# Patient Record
Sex: Female | Born: 1975 | Race: White | Hispanic: No | Marital: Married | Smoking: Never smoker
Health system: Southern US, Community
[De-identification: ages and names within clinical notes are randomized; demographics above are authoritative.]

## PROBLEM LIST (undated history)

## (undated) DIAGNOSIS — E039 Hypothyroidism, unspecified: Secondary | ICD-10-CM

## (undated) DIAGNOSIS — R51 Headache: Secondary | ICD-10-CM

## (undated) DIAGNOSIS — R112 Nausea with vomiting, unspecified: Secondary | ICD-10-CM

## (undated) DIAGNOSIS — R519 Headache, unspecified: Secondary | ICD-10-CM

## (undated) DIAGNOSIS — Z9889 Other specified postprocedural states: Secondary | ICD-10-CM

## (undated) DIAGNOSIS — K219 Gastro-esophageal reflux disease without esophagitis: Secondary | ICD-10-CM

## (undated) DIAGNOSIS — Z8489 Family history of other specified conditions: Secondary | ICD-10-CM

## (undated) DIAGNOSIS — E785 Hyperlipidemia, unspecified: Secondary | ICD-10-CM

## (undated) DIAGNOSIS — T7840XA Allergy, unspecified, initial encounter: Secondary | ICD-10-CM

## (undated) DIAGNOSIS — N809 Endometriosis, unspecified: Secondary | ICD-10-CM

## (undated) HISTORY — PX: OVARIAN CYST REMOVAL: SHX89

## (undated) HISTORY — PX: OTHER SURGICAL HISTORY: SHX169

## (undated) HISTORY — DX: Headache, unspecified: R51.9

## (undated) HISTORY — DX: Endometriosis, unspecified: N80.9

## (undated) HISTORY — DX: Gastro-esophageal reflux disease without esophagitis: K21.9

## (undated) HISTORY — DX: Headache: R51

## (undated) HISTORY — DX: Allergy, unspecified, initial encounter: T78.40XA

## (undated) HISTORY — DX: Hyperlipidemia, unspecified: E78.5

## (undated) HISTORY — PX: TONSILLECTOMY: SUR1361

## (undated) HISTORY — PX: APPENDECTOMY: SHX54

## (undated) HISTORY — PX: SHOULDER SURGERY: SHX246

---

## 2005-03-15 ENCOUNTER — Ambulatory Visit: Payer: Self-pay | Admitting: Obstetrics & Gynecology

## 2005-07-12 ENCOUNTER — Ambulatory Visit: Payer: Self-pay | Admitting: Family Medicine

## 2005-12-31 ENCOUNTER — Emergency Department: Payer: Self-pay | Admitting: Internal Medicine

## 2006-02-05 ENCOUNTER — Ambulatory Visit: Payer: Self-pay

## 2006-03-09 ENCOUNTER — Ambulatory Visit: Payer: Self-pay | Admitting: Orthopaedic Surgery

## 2006-03-20 ENCOUNTER — Encounter: Payer: Self-pay | Admitting: Orthopaedic Surgery

## 2006-03-29 ENCOUNTER — Encounter: Payer: Self-pay | Admitting: Orthopaedic Surgery

## 2006-04-28 ENCOUNTER — Encounter: Payer: Self-pay | Admitting: Orthopaedic Surgery

## 2006-07-12 ENCOUNTER — Emergency Department: Payer: Self-pay

## 2007-04-11 ENCOUNTER — Ambulatory Visit: Payer: Self-pay | Admitting: Orthopaedic Surgery

## 2007-04-17 ENCOUNTER — Ambulatory Visit: Payer: Self-pay | Admitting: Radiology

## 2007-04-19 ENCOUNTER — Ambulatory Visit: Payer: Self-pay | Admitting: Family Medicine

## 2007-05-08 ENCOUNTER — Ambulatory Visit: Payer: Self-pay | Admitting: Orthopaedic Surgery

## 2007-05-10 ENCOUNTER — Ambulatory Visit: Payer: Self-pay | Admitting: Orthopaedic Surgery

## 2008-09-01 DIAGNOSIS — Z8249 Family history of ischemic heart disease and other diseases of the circulatory system: Secondary | ICD-10-CM | POA: Insufficient documentation

## 2008-09-05 DIAGNOSIS — J301 Allergic rhinitis due to pollen: Secondary | ICD-10-CM

## 2008-09-05 HISTORY — DX: Allergic rhinitis due to pollen: J30.1

## 2009-03-04 ENCOUNTER — Other Ambulatory Visit: Payer: Self-pay

## 2009-03-04 DIAGNOSIS — R110 Nausea: Secondary | ICD-10-CM | POA: Insufficient documentation

## 2009-03-04 HISTORY — DX: Nausea: R11.0

## 2009-03-08 ENCOUNTER — Ambulatory Visit: Payer: Self-pay

## 2009-04-02 DIAGNOSIS — K2100 Gastro-esophageal reflux disease with esophagitis, without bleeding: Secondary | ICD-10-CM | POA: Insufficient documentation

## 2009-08-20 ENCOUNTER — Ambulatory Visit: Payer: Self-pay | Admitting: Obstetrics & Gynecology

## 2010-08-31 ENCOUNTER — Ambulatory Visit: Payer: Self-pay | Admitting: Family Medicine

## 2011-03-14 ENCOUNTER — Ambulatory Visit: Payer: Self-pay | Admitting: Family Medicine

## 2011-03-17 ENCOUNTER — Ambulatory Visit: Payer: Self-pay | Admitting: Family Medicine

## 2011-03-21 ENCOUNTER — Ambulatory Visit: Payer: Self-pay | Admitting: Family Medicine

## 2011-09-22 ENCOUNTER — Other Ambulatory Visit: Payer: Self-pay | Admitting: Physician Assistant

## 2011-09-22 LAB — CBC WITH DIFFERENTIAL/PLATELET
Basophil %: 1.6 %
Eosinophil #: 0.1 10*3/uL (ref 0.0–0.7)
HCT: 40.1 % (ref 35.0–47.0)
HGB: 13.6 g/dL (ref 12.0–16.0)
MCH: 28.5 pg (ref 26.0–34.0)
MCHC: 33.9 g/dL (ref 32.0–36.0)
MCV: 84 fL (ref 80–100)
Monocyte #: 0.8 x10 3/mm (ref 0.2–0.9)
Monocyte %: 10.4 %
Neutrophil #: 4.8 10*3/uL (ref 1.4–6.5)
Neutrophil %: 59.9 %
WBC: 8 10*3/uL (ref 3.6–11.0)

## 2011-09-22 LAB — MONONUCLEOSIS SCREEN: Mono Test: NEGATIVE

## 2011-09-22 LAB — TSH: Thyroid Stimulating Horm: 0.88 u[IU]/mL

## 2011-09-25 ENCOUNTER — Emergency Department: Payer: Self-pay | Admitting: Emergency Medicine

## 2011-09-25 LAB — PREGNANCY, URINE: Pregnancy Test, Urine: NEGATIVE m[IU]/mL

## 2011-09-28 ENCOUNTER — Other Ambulatory Visit: Payer: Self-pay | Admitting: Family Medicine

## 2011-09-28 LAB — COMPREHENSIVE METABOLIC PANEL
Albumin: 4.4 g/dL (ref 3.4–5.0)
Alkaline Phosphatase: 70 U/L (ref 50–136)
Calcium, Total: 9.5 mg/dL (ref 8.5–10.1)
Chloride: 106 mmol/L (ref 98–107)
Co2: 28 mmol/L (ref 21–32)
Creatinine: 0.77 mg/dL (ref 0.60–1.30)
Glucose: 80 mg/dL (ref 65–99)
Osmolality: 278 (ref 275–301)
Potassium: 4.7 mmol/L (ref 3.5–5.1)
SGOT(AST): 20 U/L (ref 15–37)
SGPT (ALT): 18 U/L
Total Protein: 7.7 g/dL (ref 6.4–8.2)

## 2011-09-28 LAB — MAGNESIUM: Magnesium: 2 mg/dL

## 2011-10-06 ENCOUNTER — Other Ambulatory Visit: Payer: Self-pay

## 2011-10-06 LAB — TSH: Thyroid Stimulating Horm: 1.28 u[IU]/mL

## 2013-05-11 IMAGING — US US THYROID
1 series · 18 of 25 positions shown · non-contrast
Comparison: none

REASON FOR EXAM: neck swelling and tenderness
COMMENTS:

PROCEDURE:     US  - US THYROID  - September 25, 2011  [DATE]
RESULT:     Standard thyroid ultrasound obtained. Size, shape, and
echotexture the thyroid normal. No evidence of mass lesion. Normal blood
flow.

[Series 1: us thyroid · 18 of 30 slices shown]
[im 1/30]
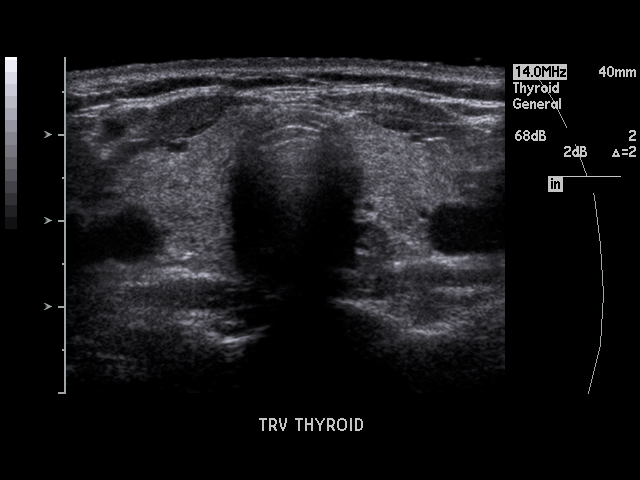
[im 3/30]
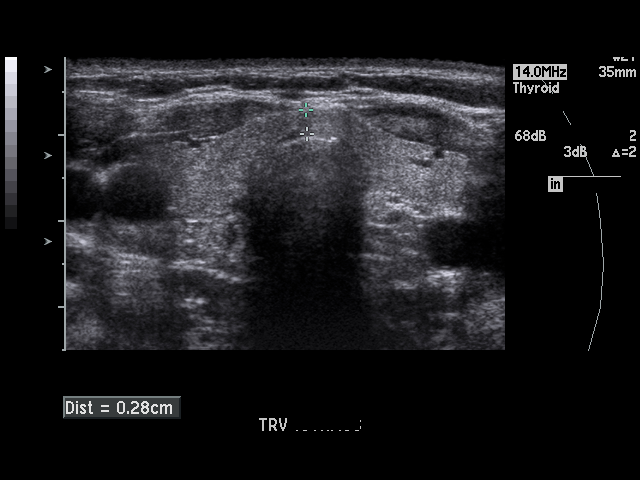
[im 4/30]
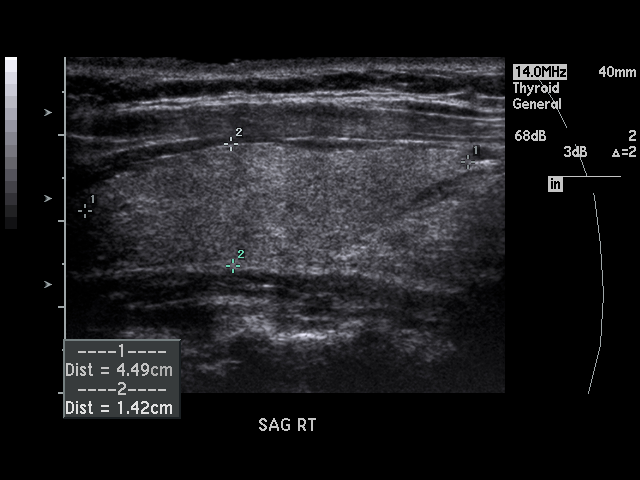
[im 5/30]
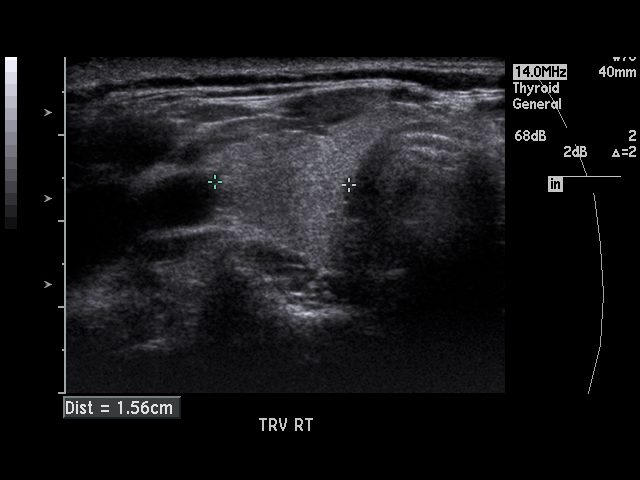
[im 8/30]
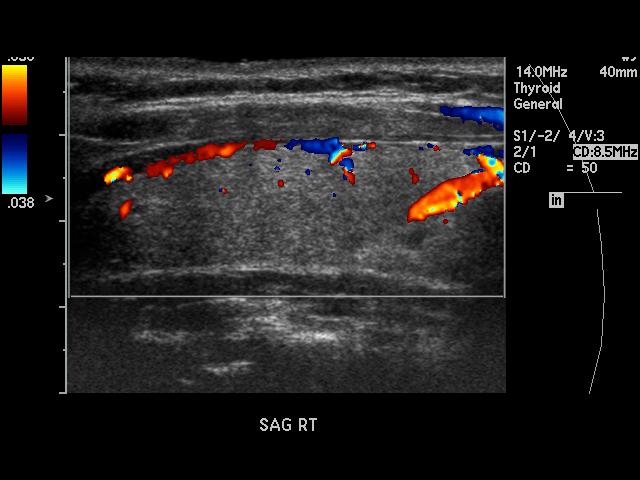
[im 9/30]
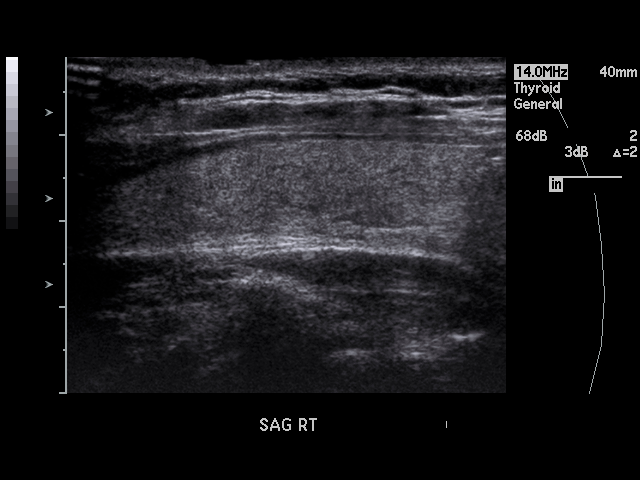
[im 11/30]
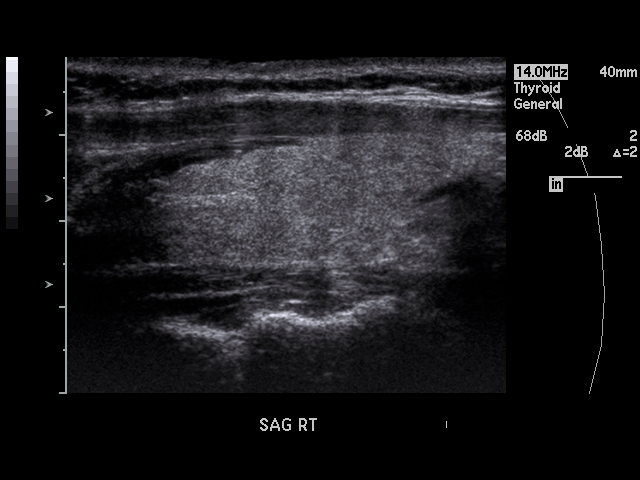
[im 13/30]
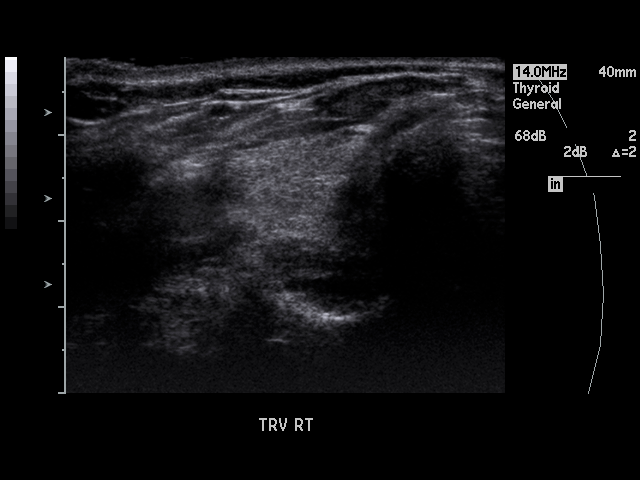
[im 14/30]
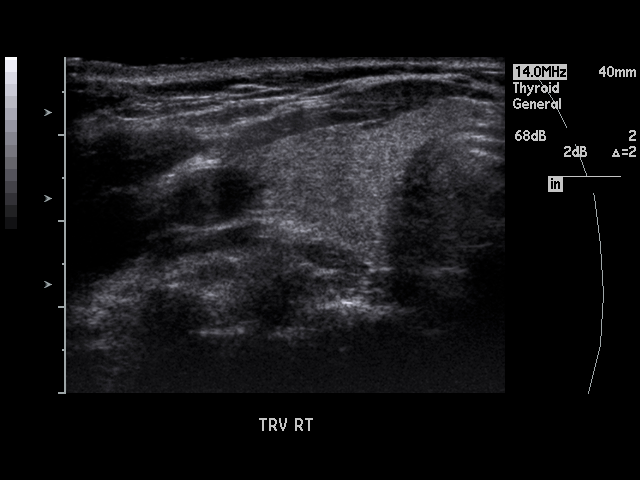
[im 16/30]
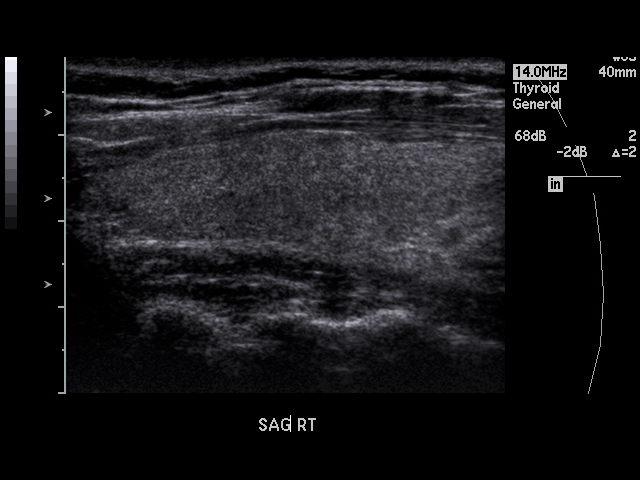
[im 17/30]
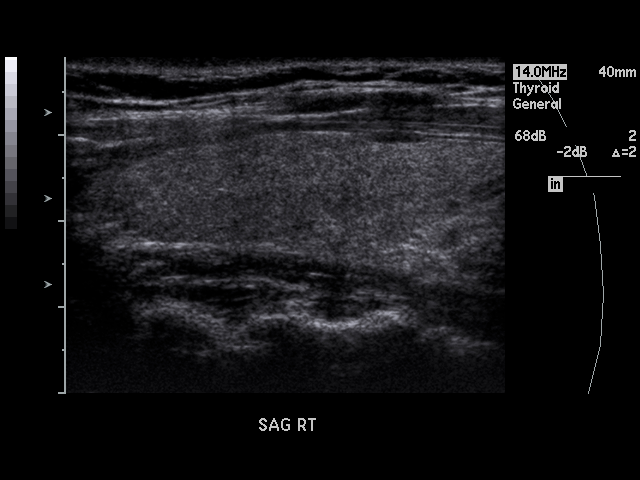
[im 19/30]
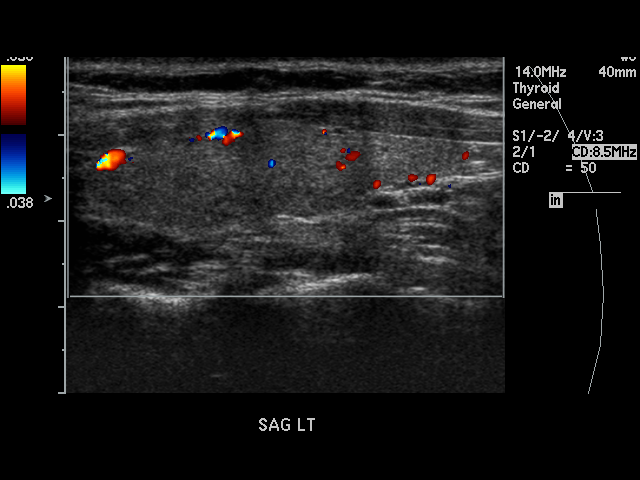
[im 21/30]
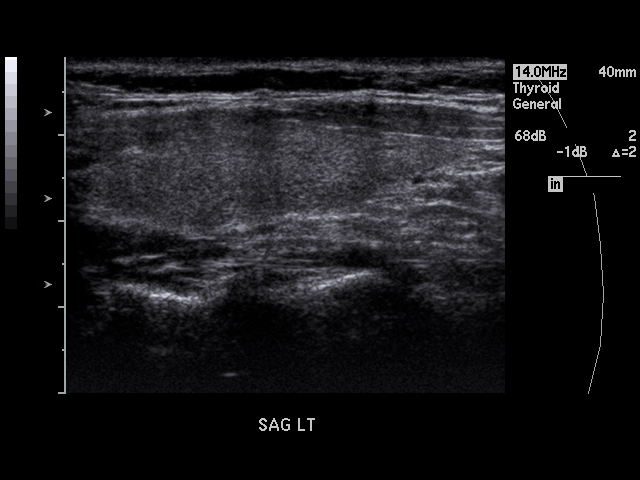
[im 22/30]
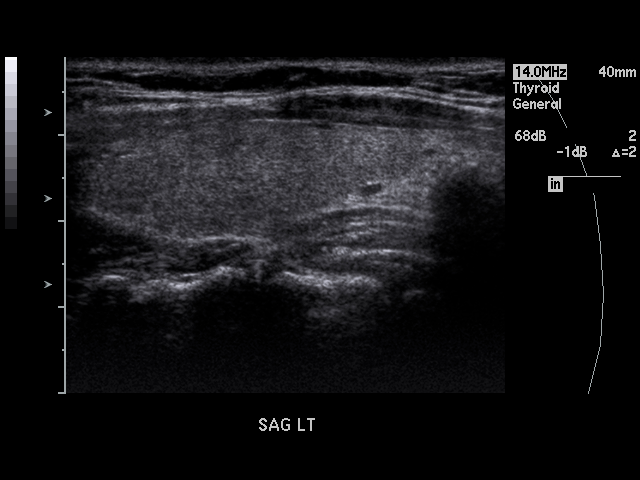
[im 25/30]
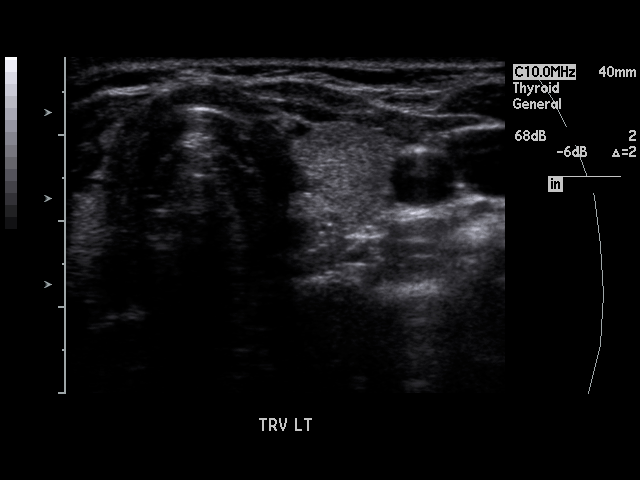
[im 26/30]
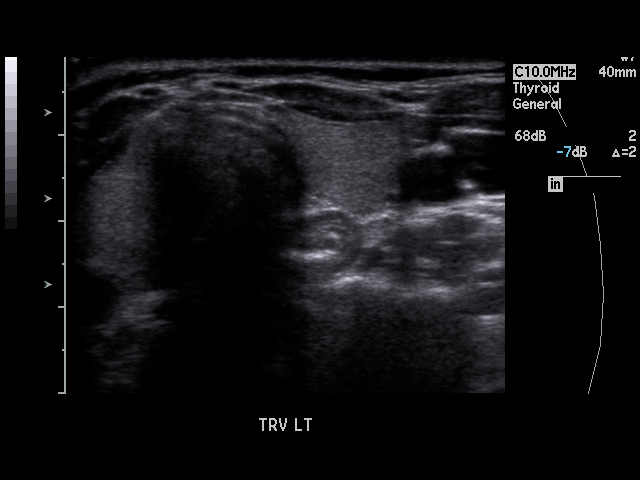
[im 27/30]
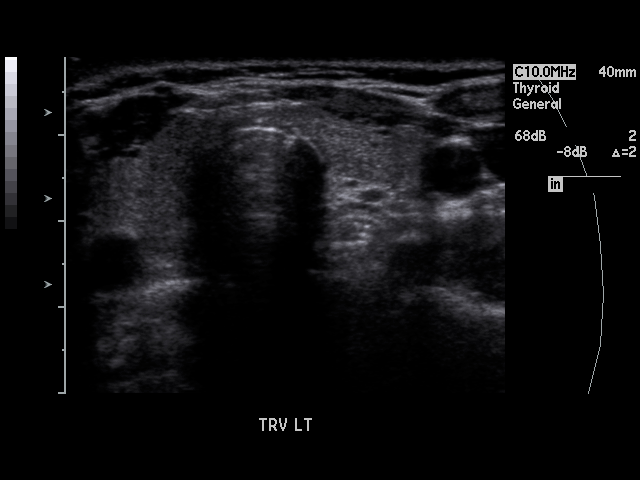
[im 30/30]
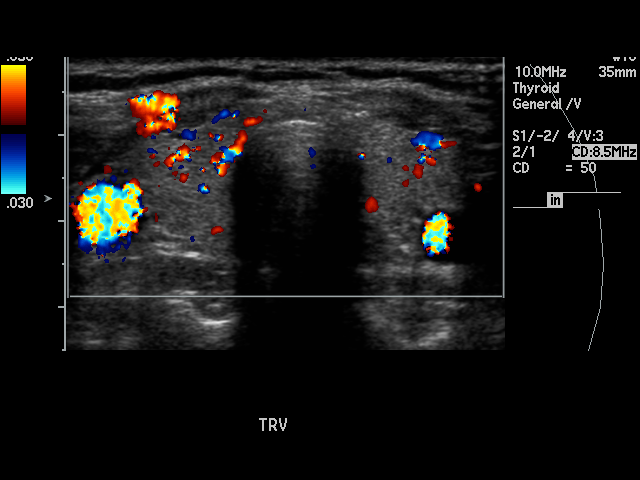

[18 of 25 positions shown; findings below may reference images not displayed]

IMPRESSION: Normal exam.

## 2014-01-27 ENCOUNTER — Ambulatory Visit: Payer: Self-pay | Admitting: Family Medicine

## 2014-01-27 LAB — HEPATIC FUNCTION PANEL A (ARMC)
ALT: 22 U/L
AST: 27 U/L (ref 15–37)
Albumin: 4.2 g/dL (ref 3.4–5.0)
Alkaline Phosphatase: 70 U/L
BILIRUBIN TOTAL: 0.6 mg/dL (ref 0.2–1.0)
Bilirubin, Direct: 0.1 mg/dL (ref 0.00–0.20)
Total Protein: 7.6 g/dL (ref 6.4–8.2)

## 2014-01-27 LAB — CBC WITH DIFFERENTIAL/PLATELET
BASOS PCT: 1.2 %
Basophil #: 0.1 10*3/uL (ref 0.0–0.1)
EOS ABS: 0.1 10*3/uL (ref 0.0–0.7)
EOS PCT: 1.4 %
HCT: 40.5 % (ref 35.0–47.0)
HGB: 13.1 g/dL (ref 12.0–16.0)
LYMPHS ABS: 1.9 10*3/uL (ref 1.0–3.6)
Lymphocyte %: 25.3 %
MCH: 27.8 pg (ref 26.0–34.0)
MCHC: 32.4 g/dL (ref 32.0–36.0)
MCV: 86 fL (ref 80–100)
MONO ABS: 0.7 x10 3/mm (ref 0.2–0.9)
Monocyte %: 8.6 %
Neutrophil #: 4.9 10*3/uL (ref 1.4–6.5)
Neutrophil %: 63.5 %
Platelet: 210 10*3/uL (ref 150–440)
RBC: 4.72 10*6/uL (ref 3.80–5.20)
RDW: 13.3 % (ref 11.5–14.5)
WBC: 7.7 10*3/uL (ref 3.6–11.0)

## 2014-01-27 LAB — SEDIMENTATION RATE: Erythrocyte Sed Rate: 3 mm/hr (ref 0–20)

## 2015-02-04 ENCOUNTER — Other Ambulatory Visit: Payer: Self-pay | Admitting: Physician Assistant

## 2015-02-04 ENCOUNTER — Ambulatory Visit
Admission: RE | Admit: 2015-02-04 | Discharge: 2015-02-04 | Disposition: A | Discharge status: Home / Self Care | Payer: 59 | Source: Ambulatory Visit | Attending: Physician Assistant | Admitting: Physician Assistant

## 2015-02-04 DIAGNOSIS — R05 Cough: Secondary | ICD-10-CM

## 2015-02-04 DIAGNOSIS — R059 Cough, unspecified: Secondary | ICD-10-CM

## 2015-06-09 DIAGNOSIS — M24811 Other specific joint derangements of right shoulder, not elsewhere classified: Secondary | ICD-10-CM | POA: Diagnosis not present

## 2015-06-09 DIAGNOSIS — M67911 Unspecified disorder of synovium and tendon, right shoulder: Secondary | ICD-10-CM | POA: Diagnosis not present

## 2016-01-17 ENCOUNTER — Ambulatory Visit: Payer: Self-pay | Admitting: Physician Assistant

## 2016-01-17 ENCOUNTER — Encounter: Payer: Self-pay | Admitting: Physician Assistant

## 2016-01-17 VITALS — BP 120/90 | HR 80 | Temp 98.3°F

## 2016-01-17 DIAGNOSIS — L259 Unspecified contact dermatitis, unspecified cause: Secondary | ICD-10-CM

## 2016-01-17 MED ORDER — DEXAMETHASONE SODIUM PHOSPHATE 10 MG/ML IJ SOLN
10.0000 mg | Freq: Once | INTRAMUSCULAR | Status: AC
  Administered 2016-01-17: 10 mg via INTRAMUSCULAR

## 2016-01-17 NOTE — Progress Notes (Signed)
S: c/o rash on ankles and wrists, not painful, just really itching, no fever/chills, hasn't been in woods and denies tick bite  O: vitals wnl, nad, skin on ankles, lower legs, and wrists/forearms with pale pink raised lesions, no pustules, bruising or redness, ENT wnl, neck supple no lymph, lungs c ta ,cv rrr  A: rash, ?insect bite ' P: decadron 10mg  im, if worsening pt is to call and will call in doxy

## 2016-02-17 DIAGNOSIS — G5601 Carpal tunnel syndrome, right upper limb: Secondary | ICD-10-CM | POA: Diagnosis not present

## 2016-02-17 DIAGNOSIS — G5602 Carpal tunnel syndrome, left upper limb: Secondary | ICD-10-CM | POA: Diagnosis not present

## 2016-03-15 DIAGNOSIS — G5601 Carpal tunnel syndrome, right upper limb: Secondary | ICD-10-CM | POA: Diagnosis not present

## 2016-03-15 DIAGNOSIS — M654 Radial styloid tenosynovitis [de Quervain]: Secondary | ICD-10-CM | POA: Diagnosis not present

## 2016-03-15 DIAGNOSIS — M25732 Osteophyte, left wrist: Secondary | ICD-10-CM | POA: Diagnosis not present

## 2016-03-15 DIAGNOSIS — G5602 Carpal tunnel syndrome, left upper limb: Secondary | ICD-10-CM | POA: Diagnosis not present

## 2016-04-12 DIAGNOSIS — M654 Radial styloid tenosynovitis [de Quervain]: Secondary | ICD-10-CM | POA: Diagnosis not present

## 2016-04-12 DIAGNOSIS — R200 Anesthesia of skin: Secondary | ICD-10-CM | POA: Diagnosis not present

## 2016-04-12 DIAGNOSIS — M778 Other enthesopathies, not elsewhere classified: Secondary | ICD-10-CM | POA: Diagnosis not present

## 2016-05-08 ENCOUNTER — Encounter: Payer: Self-pay | Admitting: Physician Assistant

## 2016-05-08 ENCOUNTER — Ambulatory Visit: Payer: Self-pay | Admitting: Physician Assistant

## 2016-05-08 VITALS — BP 110/86 | HR 80 | Temp 98.2°F

## 2016-05-08 DIAGNOSIS — J0100 Acute maxillary sinusitis, unspecified: Secondary | ICD-10-CM

## 2016-05-08 DIAGNOSIS — R6889 Other general symptoms and signs: Secondary | ICD-10-CM

## 2016-05-08 MED ORDER — TOBRAMYCIN 0.3 % OP SOLN: 2.0000 [drp] | OPHTHALMIC | 0 refills | Status: DC

## 2016-05-08 MED ORDER — PREDNISONE 10 MG PO TABS: 30.0000 mg | ORAL_TABLET | Freq: Every day | ORAL | 0 refills | Status: DC

## 2016-05-08 NOTE — Progress Notes (Signed)
S: C/o sinus pain and congestion for 3 days, no fever, chills, cp/sob, v/d; cough is sporadic, c/o of facial and dental pain.,  Also some pressure at r eye, thought she had something in it and it has been itching, no matting or drainage  Using otc meds:   O: PE: vitals wnl, nad, perrl eomi, conjunctiva wnl, normocephalic, tms dull, nasal mucosa pink and swollen, throat injected, neck supple no lymph, lungs c t a, cv rrr, neuro intact  A:  Acute sinusitis, itchy eyes   P: drink fluids, continue regular meds , use otc meds of choice, return if not improving in 5 days, return earlier if worsening , prednisone 30mg  qd x 3, tobramycin opth solution, if sinus sx worsening will call in an antibiotic, not needed at this time

## 2016-05-10 DIAGNOSIS — G5631 Lesion of radial nerve, right upper limb: Secondary | ICD-10-CM | POA: Diagnosis not present

## 2016-05-17 DIAGNOSIS — G5631 Lesion of radial nerve, right upper limb: Secondary | ICD-10-CM | POA: Diagnosis not present

## 2016-05-18 ENCOUNTER — Other Ambulatory Visit (HOSPITAL_COMMUNITY): Payer: Self-pay | Admitting: Orthopedic Surgery

## 2016-05-18 ENCOUNTER — Other Ambulatory Visit: Payer: Self-pay | Admitting: Orthopedic Surgery

## 2016-05-18 DIAGNOSIS — G5631 Lesion of radial nerve, right upper limb: Secondary | ICD-10-CM

## 2016-05-30 ENCOUNTER — Ambulatory Visit (HOSPITAL_COMMUNITY)
Admission: RE | Admit: 2016-05-30 | Discharge: 2016-05-30 | Disposition: A | Discharge status: Home / Self Care | Payer: 59 | Source: Ambulatory Visit | Attending: Orthopedic Surgery | Admitting: Orthopedic Surgery

## 2016-05-30 DIAGNOSIS — G5631 Lesion of radial nerve, right upper limb: Secondary | ICD-10-CM | POA: Insufficient documentation

## 2016-05-30 DIAGNOSIS — Z0389 Encounter for observation for other suspected diseases and conditions ruled out: Secondary | ICD-10-CM | POA: Diagnosis not present

## 2016-06-01 DIAGNOSIS — G5631 Lesion of radial nerve, right upper limb: Secondary | ICD-10-CM | POA: Diagnosis not present

## 2016-06-05 DIAGNOSIS — G5631 Lesion of radial nerve, right upper limb: Secondary | ICD-10-CM | POA: Diagnosis not present

## 2016-07-03 DIAGNOSIS — R200 Anesthesia of skin: Secondary | ICD-10-CM | POA: Diagnosis not present

## 2016-07-12 ENCOUNTER — Ambulatory Visit (INDEPENDENT_AMBULATORY_CARE_PROVIDER_SITE_OTHER): Payer: 59 | Admitting: Neurology

## 2016-07-12 ENCOUNTER — Encounter: Payer: Self-pay | Admitting: Neurology

## 2016-07-12 VITALS — BP 127/85 | HR 67 | Resp 16 | Ht 62.0 in | Wt 159.0 lb

## 2016-07-12 DIAGNOSIS — M50123 Cervical disc disorder at C6-C7 level with radiculopathy: Secondary | ICD-10-CM

## 2016-07-12 DIAGNOSIS — G5631 Lesion of radial nerve, right upper limb: Secondary | ICD-10-CM | POA: Diagnosis not present

## 2016-07-12 DIAGNOSIS — Z79899 Other long term (current) drug therapy: Secondary | ICD-10-CM | POA: Diagnosis not present

## 2016-07-12 DIAGNOSIS — R29898 Other symptoms and signs involving the musculoskeletal system: Secondary | ICD-10-CM | POA: Diagnosis not present

## 2016-07-12 MED ORDER — PREGABALIN 50 MG PO CAPS: 50.0000 mg | ORAL_CAPSULE | Freq: Two times a day (BID) | ORAL | 6 refills | Status: DC

## 2016-07-12 NOTE — Progress Notes (Signed)
Lyrica rx printed, signed, faxed to pharmacy.

## 2016-07-12 NOTE — Patient Instructions (Signed)
Remember to drink plenty of fluid, eat healthy meals and do not skip any meals. Try to eat protein with a every meal and eat a healthy snack such as fruit or nuts in between meals. Try to keep a regular sleep-wake schedule and try to exercise daily, particularly in the form of walking, 20-30 minutes a day, if you can.   As far as your medications are concerned, I would like to suggest: Lyrica 50mg  twice daily  As far as diagnostic testing: emg/ncs in 8-12 weeks, MRI cervical spine, Physical Therapy, Labs  I would like to see you back for emg/ncs, sooner if we need to. Please call us with any interim questions, concerns, problems, updates or refill requests.   Our phone number is [phone]. We also have an after hours call service for urgent matters and there is a physician on-call for urgent questions. For any emergencies you know to call 911 or go to the nearest emergency room  Pregabalin capsules What is this medicine? PREGABALIN (pre GAB a lin) is used to treat nerve pain from diabetes, shingles, spinal cord injury, and fibromyalgia. It is also used to control seizures in epilepsy. This medicine may be used for other purposes; ask your health care provider or pharmacist if you have questions. COMMON BRAND NAME(S): Lyrica What should I tell my health care provider before I take this medicine? They need to know if you have any of these conditions: -bleeding problems -heart disease, including heart failure -history of alcohol or drug abuse -kidney disease -suicidal thoughts, plans, or attempt; a previous suicide attempt by you or a family member -an unusual or allergic reaction to pregabalin, gabapentin, other medicines, foods, dyes, or preservatives -pregnant or trying to get pregnant or trying to conceive with your partner -breast-feeding How should I use this medicine? Take this medicine by mouth with a glass of water. Follow the directions on the prescription label. You can take this  medicine with or without food. Take your doses at regular intervals. Do not take your medicine more often than directed. Do not stop taking except on your doctor's advice. A special MedGuide will be given to you by the pharmacist with each prescription and refill. Be sure to read this information carefully each time. Talk to your pediatrician regarding the use of this medicine in children. Special care may be needed. Overdosage: If you think you have taken too much of this medicine contact a poison control center or emergency room at once. NOTE: This medicine is only for you. Do not share this medicine with others. What if I miss a dose? If you miss a dose, take it as soon as you can. If it is almost time for your next dose, take only that dose. Do not take double or extra doses. What may interact with this medicine? -alcohol -certain medicines for blood pressure like captopril, enalapril, or lisinopril -certain medicines for diabetes, like pioglitazone or rosiglitazone -certain medicines for anxiety or sleep -narcotic medicines for pain This list may not describe all possible interactions. Give your health care provider a list of all the medicines, herbs, non-prescription drugs, or dietary supplements you use. Also tell them if you smoke, drink alcohol, or use illegal drugs. Some items may interact with your medicine. What should I watch for while using this medicine? Tell your doctor or healthcare professional if your symptoms do not start to get better or if they get worse. Visit your doctor or health care professional for regular checks on your  progress. Do not stop taking except on your doctor's advice. You may develop a severe reaction. Your doctor will tell you how much medicine to take. Wear a medical identification bracelet or chain if you are taking this medicine for seizures, and carry a card that describes your disease and details of your medicine and dosage times. You may get drowsy or  dizzy. Do not drive, use machinery, or do anything that needs mental alertness until you know how this medicine affects you. Do not stand or sit up quickly, especially if you are an older patient. This reduces the risk of dizzy or fainting spells. Alcohol may interfere with the effect of this medicine. Avoid alcoholic drinks. If you have a heart condition, like congestive heart failure, and notice that you are retaining water and have swelling in your hands or feet, contact your health care provider immediately. The use of this medicine may increase the chance of suicidal thoughts or actions. Pay special attention to how you are responding while on this medicine. Any worsening of mood, or thoughts of suicide or dying should be reported to your health care professional right away. This medicine has caused reduced sperm counts in some men. This may interfere with the ability to father a child. You should talk to your doctor or health care professional if you are concerned about your fertility. Women who become pregnant while using this medicine for seizures may enroll in the [place] Pregnancy Registry by calling [phone]. This registry collects information about the safety of antiepileptic drug use during pregnancy. What side effects may I notice from receiving this medicine? Side effects that you should report to your doctor or health care professional as soon as possible: -allergic reactions like skin rash, itching or hives, swelling of the face, lips, or tongue -breathing problems -changes in vision -chest pain -confusion -jerking or unusual movements of any part of your body -loss of memory -muscle pain, tenderness, or weakness -suicidal thoughts or other mood changes -swelling of the ankles, feet, hands -unusual bruising or bleeding Side effects that usually do not require medical attention (report to your doctor or health care professional if they continue or are  bothersome): -dizziness -drowsiness -dry mouth -headache -nausea -tremors -trouble sleeping -weight gain This list may not describe all possible side effects. Call your doctor for medical advice about side effects. You may report side effects to FDA at 1-800-FDA-1088. Where should I keep my medicine? Keep out of the reach of children. This medicine can be abused. Keep your medicine in a safe place to protect it from theft. Do not share this medicine with anyone. Selling or giving away this medicine is dangerous and against the law. This medicine may cause accidental overdose and death if it taken by other adults, children, or pets. Mix any unused medicine with a substance like cat litter or coffee grounds. Then throw the medicine away in a sealed container like a sealed bag or a coffee can with a lid. Do not use the medicine after the expiration date. Store at room temperature between 15 and 30 degrees C (59 and 86 degrees F). NOTE: This sheet is a summary. It may not cover all possible information. If you have questions about this medicine, talk to your doctor, pharmacist, or health care provider.  2017 Elsevier/Gold Standard (2015-06-17 10:26:12)

## 2016-07-12 NOTE — Progress Notes (Signed)
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 female here as a referral from Dr. Caryn Section for right upper extremity pain and numbness. She has severe pain in the dorsal aspect of the arm radiating down to all fingers. She has tingling in digit 4-5 and pain in digits 1-3. Symptoms started over a year ago or longer she denies any inciting events, no trauma, no compression, no crtches, didn;t sleep on it wrong, no inciting events at all. It was slowly progressive. She was also having pain in the left arm but oral prednisone helped. She has "aches and pains all over" but the right is the worst. They tried several injections and an MRI brachial plexus was normal. She has burning in the neck. The MRI examined the right arm as well. There was no compresison in the arm and the brachial plexus was normal. She has weakness in the right hand even trying to write is challenging. She drops things a lot with the right hand. She feels weakness in the biceps and triceps. No pain or weakness in the biceps. She has grip weakness, difficult to start IVs on patients. Mother has Fibromyalgia and has a lot of the same symptoms. Father with RA. Neurontin helped but made her foggy. No other focal neurologic deficits, associated symptoms, inciting events or modifiable factors.   Reviewed notes, labs and imaging from outside physicians, which showed:  Reviewed extensive notes from [place]. The patient initially presented for right upper extremity pain. She underwent selective nerve block of the right radial nerve at the level of the spiral groove on 06/05/2016. She reports that her wrist felt heavy during the lidocaine face. The pain in her arm was not affected during the lidocaine phase nor beyond  then. She has tingling to light touch in the dorsal aspect of the ring and small finger. The numbness and tingling in the dorsum of the hand radially comes and goes. She reports a shooting pain that traveled down her arm. She did undergo MRI scan of her right brachial plexus which failed to reveal any abnormalities of the plexus itself for any extrinsic compression or mass effect. Her symptoms are pain radiating down the arm and forearm on the dorsum. She also occasionally has some tingling in the dorsum of the ring and small fingers particularly of having to rest the medial portion of her elbow on something. She's been seeing [facility] since September 2017 for this issue. They also injected her right dorsal compartment tendon sheath. It appears she also had left-sided symptoms. 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 Pertinent negatives per HPI. All others negative.   Social History   Social History  . Marital status: Married    Spouse name: N/A  . Number of children: 0  . Years of education: Masters    Occupational History  . Not on file.   Social History Main Topics  . Smoking status: Never Smoker  . Smokeless tobacco: Never Used  . Alcohol use Yes  . Drug use: No  . Sexual activity: Not on file   Other Topics Concern  .  Not on file   Social History Narrative   Drinks 1 caffeine drink a day     Family History  Problem Relation Age of Onset  . Fibromyalgia Mother   . Rheum arthritis Father   . Heart attack Father   . Breast cancer Maternal Grandmother   . Colon cancer Maternal Grandmother   . Heart attack Paternal Grandfather   . Esophageal cancer Paternal Grandfather   . Neuropathy Neg Hx     Past Medical History:  Diagnosis Date  . Endometriosis   . Headache      Past Surgical History:  Procedure Laterality Date  . APPENDECTOMY    . labrium repair    . OVARIAN CYST REMOVAL    . SHOULDER SURGERY    . TONSILLECTOMY      Current Outpatient Prescriptions  Medication Sig Dispense Refill  . Cholecalciferol (VITAMIN D) 2000 units CAPS Take by mouth.    . Multiple Vitamin (MULTIVITAMIN) capsule Take 1 capsule by mouth daily.    . pregabalin (LYRICA) 50 MG capsule Take 1 capsule (50 mg total) by mouth 2 (two) times daily. 60 capsule 6   No current facility-administered medications for this visit.     Allergies as of 07/12/2016 - Review Complete 07/12/2016  Allergen Reaction Noted  . Morphine and related  09/01/2008  . Ultram  [tramadol]      Vitals: BP 127/85   Pulse 67   Resp 16   Ht 5\' 2"  (1.575 m)   Wt 159 lb (72.1 kg)   BMI 29.08 kg/m  Last Weight:  Wt Readings from Last 1 Encounters:  07/12/16 159 lb (72.1 kg)   Last Height:   Ht Readings from Last 1 Encounters:  07/12/16 5\' 2"  (1.575 m)    Physical exam: Exam: Gen: NAD, conversant, well nourised, obese, well groomed                     CV: RRR, no MRG. No Carotid Bruits. No peripheral edema, warm, nontender Eyes: Conjunctivae clear without exudates or hemorrhage  Neuro: Detailed Neurologic Exam  Speech:    Speech is normal; fluent and spontaneous with normal comprehension.  Cognition:    The patient is oriented to person, place, and time;     recent and remote memory intact;     language fluent;     normal attention, concentration,     fund of knowledge Cranial Nerves:    The pupils are equal, round, and reactive to light. The fundi are normal and spontaneous venous pulsations are present. Visual fields are full to finger confrontation. Extraocular movements are intact. Trigeminal sensation is intact and the muscles of mastication are normal. The face is symmetric. The palate elevates in the midline. Hearing intact. Voice is normal. Shoulder shrug is normal. The  tongue has normal motion without fasciculations.   Coordination:    Normal finger to nose and heel to shin. Normal rapid alternating movements.   Gait:    Heel-toe and tandem gait are normal.   Motor Observation:    No asymmetry, no atrophy, and no involuntary movements noted. Tone:    Normal muscle tone.    Posture:    Posture is normal. normal erect    Strength: Right delt 4/5 Right Triceps 5-/5 Right Brachioradialis 4+-5-  Otherwise intact     Sensation: dec radial distribution     Reflex Exam:  DTR's:   Reflexes intact including triceps and symmetric.  Deep tendon reflexes in  the upper and lower extremities are normal bilaterally.   Toes:    The toes are downgoing bilaterally.   Clonus:    Clonus is absent.  Assessment/Plan:  41 year old female with right arm weakness. 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    MRI cervical spine: emg/ncs and clinical exam concerning for c6/c7 radiculopathy need evaluation for surgical procedure or ESI Physical therapy right arm Lyrica 50 mg twice a day for pain Labs today Repeat emg/ncs 8 weeks   Sarina Ill, MD  [facility] [address] [place], [place] [address]  Phone [phone] Fax [phone]

## 2016-07-13 ENCOUNTER — Encounter: Payer: Self-pay | Admitting: Neurology

## 2016-07-13 ENCOUNTER — Telehealth: Payer: Self-pay

## 2016-07-13 ENCOUNTER — Telehealth: Payer: Self-pay | Admitting: Neurology

## 2016-07-13 DIAGNOSIS — R29898 Other symptoms and signs involving the musculoskeletal system: Secondary | ICD-10-CM | POA: Insufficient documentation

## 2016-07-13 LAB — CBC
Hematocrit: 39.8 % (ref 34.0–46.6)
Hemoglobin: 13.3 g/dL (ref 11.1–15.9)
MCH: 28.1 pg (ref 26.6–33.0)
MCHC: 33.4 g/dL (ref 31.5–35.7)
MCV: 84 fL (ref 79–97)
PLATELETS: 236 10*3/uL (ref 150–379)
RBC: 4.73 x10E6/uL (ref 3.77–5.28)
RDW: 13.8 % (ref 12.3–15.4)
WBC: 6.7 10*3/uL (ref 3.4–10.8)

## 2016-07-13 LAB — COMPREHENSIVE METABOLIC PANEL
A/G RATIO: 2.1 (ref 1.2–2.2)
ALK PHOS: 59 IU/L (ref 39–117)
ALT: 9 IU/L (ref 0–32)
AST: 13 IU/L (ref 0–40)
Albumin: 4.8 g/dL (ref 3.5–5.5)
BUN/Creatinine Ratio: 11 (ref 9–23)
BUN: 9 mg/dL (ref 6–24)
Bilirubin Total: 0.4 mg/dL (ref 0.0–1.2)
CO2: 24 mmol/L (ref 18–29)
Calcium: 9.5 mg/dL (ref 8.7–10.2)
Chloride: 103 mmol/L (ref 96–106)
Creatinine, Ser: 0.84 mg/dL (ref 0.57–1.00)
GFR calc Af Amer: 101 mL/min/{1.73_m2} (ref >59)
GFR calc non Af Amer: 87 mL/min/{1.73_m2} (ref >59)
GLOBULIN, TOTAL: 2.3 g/dL (ref 1.5–4.5)
Glucose: 88 mg/dL (ref 65–99)
POTASSIUM: 5 mmol/L (ref 3.5–5.2)
SODIUM: 142 mmol/L (ref 134–144)
Total Protein: 7.1 g/dL (ref 6.0–8.5)

## 2016-07-13 NOTE — Telephone Encounter (Signed)
Patient is returning your call. She will be in a meeting from 10am to 11am today.

## 2016-07-13 NOTE — Telephone Encounter (Signed)
Called pt w/ normal lab results. Verbalized understanding and appreciation for call. 

## 2016-07-13 NOTE — Telephone Encounter (Signed)
-----   Message from Melvenia Beam, MD sent at 07/13/2016 11:41 AM EST ----- Labs normal thanks

## 2016-07-13 NOTE — Telephone Encounter (Signed)
Returned patient called she wanted her MRI at [facility] she is schedule for 07/24/16 to arrive at 1:30 pm patient is aware of this.

## 2016-07-21 ENCOUNTER — Telehealth: Payer: Self-pay | Admitting: Neurology

## 2016-07-21 NOTE — Telephone Encounter (Signed)
07/12/16 OV faxed to Dr Shanon Brow Thompson/[facility]/[place] [phone]  (f) [phone]

## 2016-07-24 ENCOUNTER — Ambulatory Visit
Admission: RE | Admit: 2016-07-24 | Discharge: 2016-07-24 | Disposition: A | Discharge status: Home / Self Care | Payer: 59 | Source: Ambulatory Visit | Attending: Neurology | Admitting: Neurology

## 2016-07-24 DIAGNOSIS — M50222 Other cervical disc displacement at C5-C6 level: Secondary | ICD-10-CM | POA: Diagnosis not present

## 2016-07-24 DIAGNOSIS — G5631 Lesion of radial nerve, right upper limb: Secondary | ICD-10-CM | POA: Insufficient documentation

## 2016-07-24 DIAGNOSIS — R29898 Other symptoms and signs involving the musculoskeletal system: Secondary | ICD-10-CM | POA: Diagnosis not present

## 2016-07-24 DIAGNOSIS — M50221 Other cervical disc displacement at C4-C5 level: Secondary | ICD-10-CM | POA: Diagnosis not present

## 2016-07-24 DIAGNOSIS — M50123 Cervical disc disorder at C6-C7 level with radiculopathy: Secondary | ICD-10-CM | POA: Insufficient documentation

## 2016-07-26 ENCOUNTER — Encounter: Payer: Self-pay | Admitting: Neurology

## 2016-08-01 ENCOUNTER — Encounter: Payer: Self-pay | Admitting: Physical Therapy

## 2016-08-01 ENCOUNTER — Ambulatory Visit: Payer: 59 | Attending: Neurology | Admitting: Physical Therapy

## 2016-08-01 VITALS — BP 147/105 | HR 88

## 2016-08-01 DIAGNOSIS — R293 Abnormal posture: Secondary | ICD-10-CM | POA: Diagnosis not present

## 2016-08-01 DIAGNOSIS — M79601 Pain in right arm: Secondary | ICD-10-CM | POA: Insufficient documentation
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
## 2016-08-03 ENCOUNTER — Encounter: Payer: 59 | Admitting: Physical Therapy

## 2016-08-09 ENCOUNTER — Encounter: Payer: Self-pay | Admitting: Physical Therapy

## 2016-08-09 ENCOUNTER — Ambulatory Visit: Payer: 59 | Admitting: Physical Therapy

## 2016-08-09 DIAGNOSIS — R293 Abnormal posture: Secondary | ICD-10-CM

## 2016-08-09 DIAGNOSIS — M79601 Pain in right arm: Secondary | ICD-10-CM

## 2016-08-09 NOTE — Therapy (Signed)
[facility] MAIN [facility] SERVICES [place], [place], [address] Phone: [phone]   Fax:  [phone]  Physical Therapy Treatment  Patient Details  Name: Lynn Holt MRN: [id number] Date of Birth: [birth year] Referring Provider: Melvenia Beam, MD  Encounter Date: 08/09/2016      PT End of Session - 08/09/16 1438    Visit Number (P)  2   Number of Visits (P)  13   Date for PT Re-Evaluation (P)  09/12/16   Authorization Type (P)  no g codes   PT Start Time (P)  0145   PT Stop Time (P)  0230   PT Time Calculation (min) (P)  45 min   Activity Tolerance (P)  Patient tolerated treatment well   Behavior During Therapy (P)  WFL for tasks assessed/performed      Past Medical History:  Diagnosis Date  . Endometriosis   . Headache     Past Surgical History:  Procedure Laterality Date  . APPENDECTOMY    . labrium repair    . OVARIAN CYST REMOVAL    . SHOULDER SURGERY    . TONSILLECTOMY      There were no vitals filed for this visit.      Subjective Assessment - 08/09/16 1356    Subjective Patient is taking lyrica and it is helping. she was having shooting pain in RUE and now it is a 5/10 pain. She is working full time 60 hours/ week.    Currently in Pain? Yes   Pain Score 5    Pain Location Arm   Pain Orientation Right   Pain Descriptors / Indicators Aching   Pain Type Chronic pain   Pain Radiating Towards from right shoudler to fingers   Pain Onset More than a month ago   Pain Frequency Constant   Aggravating Factors  lifting her UE's   Pain Relieving Factors lyrica   Effect of Pain on Daily Activities pain  that increases towards the end of the day      TREATMENT   Therapeutic Exercise:   Seated scapular retraction with tactile and verbal cues and  feedback. 10x10 second holds. Chin tuck x 10 sec x 10 reps  .   Manual Therapy:  STM to: right triceps tendon x 5 mintues Cross friction to R triceps tendon x30  seconds  Thoracic mobilization T1- T8 PA glides grade 3 30 bouts x 3 Cervical PA glides grade 2 and 3 x 30 bouts x 3 Patient has pain in right shoulder that radiates to right hand and has tingling in 4th and 5th digit. She feels more sensation in 4th and 5th digit following therapy.                           PT Education - 08/09/16 1437    Education provided Yes   Education Details ice and friction massage for elbow   Person(s) Educated Patient   Methods Explanation   Comprehension Verbalized understanding             PT Long Term Goals - 08/01/16 1921      PT LONG TERM GOAL #1   Title Pt will demonstrate at least 4+/5 strength with MMT throughout RUE to demonstrate improved strength and functional ability   Baseline See Eval note   Time 6   Period Weeks   Status New     PT LONG TERM GOAL #2  Title Pt's Quick Dash score will improve by 8 points to indicate an improvement in functional use of RUE   Baseline 52.27% and 43.75% for work module   Time 4   Period Weeks   Status New     PT LONG TERM GOAL #3   Title Pt will report worst pain as 3/10 in RUE for improved QOL   Baseline Worst 8/10   Time 6   Period Weeks   Status New     PT LONG TERM GOAL #4   Title Pt will be able to complete all work duties painfree and report worst pain at end of work day as a 3/10 in [place] for improved QOL    Baseline Pain with work activities with worst pain 8/10 at end of day   Time 6   Period Weeks   Status New     PT LONG TERM GOAL #5   Title Pt will demonstrate a negative Median and Radial ULTT to demonstrate improvement in neural involvement in RUE pain   Baseline positive   Time 4   Period Weeks   Status New               Plan - 08/09/16 1607    Clinical Impression Statement Patient presents with continued pain to RUE. She was educated in friction massage to R triceps tendon with massage cream. She responded to manual therapy for thoracic spine,  friction massage to triceps R elbow and posture education followed by chin tucks and scapula retraction exercises. She will continue to benefit from silled PT to  decrease pain and improve functional goals.    Rehab Potential Good   Clinical Impairments Affecting Rehab Potential (-) Chornic nature of pain and multisystem involvement (+) Pt with a medical background and very motivated to return to PLOF, age   PT Frequency 2x / week   PT Duration 6 weeks   PT Treatment/Interventions ADLs/Self Care Home Management;Cryotherapy;Electrical Stimulation;Iontophoresis 4mg /ml Dexamethasone;Moist Heat;Ultrasound;Functional mobility training;Therapeutic activities;Therapeutic exercise;Neuromuscular re-education;Patient/family education;Manual techniques;Passive range of motion;Dry needling;Taping   PT Next Visit Plan Cervical spine screen, initiate strengthening program, assess response to neural gliding HEP and progress as appropriate   PT Home Exercise Plan Median and radial neural glides via HEP2Go   Consulted and Agree with Plan of Care Patient      Patient will benefit from skilled therapeutic intervention in order to improve the following deficits and impairments:  Decreased range of motion, Decreased strength, Hypomobility, Increased fascial restricitons, Increased muscle spasms, Impaired perceived functional ability, Impaired flexibility, Impaired sensation, Impaired UE functional use, Improper body mechanics, Postural dysfunction, Pain  Visit Diagnosis: Pain in right arm  Abnormal posture     Problem List Patient Active Problem List   Diagnosis Date Noted  . Right arm weakness 07/13/2016  Alanson Puls, PT, DPT  [place], [place] S 08/09/2016, 5:05 PM  [place] MAIN [facility] SERVICES [address] [place], [place], [address] Phone: [phone]   Fax:  [phone]  Name: Lynn Holt MRN: [id number] Date of Birth: Sep 26, 1975

## 2016-08-14 ENCOUNTER — Encounter: Payer: Self-pay | Admitting: Physical Therapy

## 2016-08-14 ENCOUNTER — Encounter: Payer: Self-pay | Admitting: Neurology

## 2016-08-14 ENCOUNTER — Ambulatory Visit: Payer: 59 | Admitting: Physical Therapy

## 2016-08-14 DIAGNOSIS — M79601 Pain in right arm: Secondary | ICD-10-CM | POA: Diagnosis not present

## 2016-08-14 DIAGNOSIS — R293 Abnormal posture: Secondary | ICD-10-CM | POA: Diagnosis not present
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
## 2016-08-16 ENCOUNTER — Encounter: Payer: Self-pay | Admitting: Physical Therapy

## 2016-08-16 ENCOUNTER — Ambulatory Visit: Payer: 59 | Admitting: Physical Therapy

## 2016-08-16 DIAGNOSIS — M79601 Pain in right arm: Secondary | ICD-10-CM | POA: Diagnosis not present

## 2016-08-16 DIAGNOSIS — R293 Abnormal posture: Secondary | ICD-10-CM | POA: Diagnosis not present
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
## 2016-08-18 ENCOUNTER — Other Ambulatory Visit: Payer: Self-pay | Admitting: Neurology

## 2016-08-18 MED ORDER — PREGABALIN 100 MG PO CAPS: 100.0000 mg | ORAL_CAPSULE | Freq: Two times a day (BID) | ORAL | 5 refills | Status: DC

## 2016-08-18 NOTE — Progress Notes (Signed)
Rx faxed to [facility].

## 2016-08-21 ENCOUNTER — Ambulatory Visit: Payer: 59 | Admitting: Physical Therapy

## 2016-08-21 ENCOUNTER — Ambulatory Visit: Payer: 59

## 2016-08-21 DIAGNOSIS — R293 Abnormal posture: Secondary | ICD-10-CM | POA: Diagnosis not present

## 2016-08-21 DIAGNOSIS — M79601 Pain in right arm: Secondary | ICD-10-CM | POA: Diagnosis not present
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
## 2016-08-23 ENCOUNTER — Ambulatory Visit: Payer: 59 | Admitting: Physical Therapy

## 2016-08-28 ENCOUNTER — Encounter: Payer: Self-pay | Admitting: Neurology

## 2016-08-28 ENCOUNTER — Encounter: Payer: Self-pay | Admitting: Physical Therapy

## 2016-08-28 ENCOUNTER — Ambulatory Visit: Payer: 59 | Attending: Neurology | Admitting: Physical Therapy

## 2016-08-28 DIAGNOSIS — R293 Abnormal posture: Secondary | ICD-10-CM

## 2016-08-28 DIAGNOSIS — M79601 Pain in right arm: Secondary | ICD-10-CM
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
## 2016-08-29 ENCOUNTER — Telehealth: Payer: Self-pay | Admitting: *Deleted

## 2016-08-29 NOTE — Telephone Encounter (Signed)
Pt Matrix form on [employer].

## 2016-08-30 ENCOUNTER — Encounter: Payer: Self-pay | Admitting: Physical Therapy

## 2016-08-30 ENCOUNTER — Ambulatory Visit: Payer: 59 | Admitting: Physical Therapy

## 2016-08-30 DIAGNOSIS — M79601 Pain in right arm: Secondary | ICD-10-CM | POA: Diagnosis not present

## 2016-08-30 DIAGNOSIS — R293 Abnormal posture: Secondary | ICD-10-CM
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
## 2016-09-05 ENCOUNTER — Ambulatory Visit: Payer: 59 | Admitting: Physical Therapy

## 2016-09-05 ENCOUNTER — Encounter: Payer: Self-pay | Admitting: Physical Therapy

## 2016-09-05 DIAGNOSIS — R293 Abnormal posture: Secondary | ICD-10-CM

## 2016-09-05 DIAGNOSIS — M79601 Pain in right arm: Secondary | ICD-10-CM | POA: Diagnosis not present
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
## 2016-09-07 ENCOUNTER — Encounter: Payer: Self-pay | Admitting: Physical Therapy

## 2016-09-07 ENCOUNTER — Ambulatory Visit (INDEPENDENT_AMBULATORY_CARE_PROVIDER_SITE_OTHER): Payer: 59 | Admitting: Neurology

## 2016-09-07 ENCOUNTER — Ambulatory Visit (INDEPENDENT_AMBULATORY_CARE_PROVIDER_SITE_OTHER): Payer: Self-pay | Admitting: Neurology

## 2016-09-07 ENCOUNTER — Ambulatory Visit: Payer: 59 | Admitting: Physical Therapy

## 2016-09-07 DIAGNOSIS — R293 Abnormal posture: Secondary | ICD-10-CM

## 2016-09-07 DIAGNOSIS — G5631 Lesion of radial nerve, right upper limb: Secondary | ICD-10-CM

## 2016-09-07 DIAGNOSIS — Z0289 Encounter for other administrative examinations: Secondary | ICD-10-CM

## 2016-09-07 DIAGNOSIS — M79601 Pain in right arm: Secondary | ICD-10-CM
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
## 2016-09-10 NOTE — Progress Notes (Signed)
See procedure note.

## 2016-09-11 NOTE — Progress Notes (Signed)
Full Name: Tiyonna Sardinha Gender: Female MRN #: [id number] Date of Birth: 07/24/1975    Visit Date: 09/07/2016 08:08 Age: 41 Years 37 Months Old Examining Physician: Sarina Ill, MD  Referring Physician:  Lelon Huh, MD  Summary: All nerves and muscles (as indicated in the following tables) were within normal limits.      Conclusion: This is a normal study  Sarina Ill, M.D.  [facility] [place], [place] [address] Tel: [phone] Fax: [phone]        [facility]    Nerve / Sites Rec. Site Latency Ref. Amplitude Ref. Rel Amp Segments Distance Velocity Ref. Area    ms ms mV mV %  cm m/s m/s mVms  R Median - APB     Wrist APB 3.0 ?4.4 7.0 ?4.0 100 Wrist - APB 7   22.9     Upper arm APB 7.0  6.7  95.7 Upper arm - Wrist 22 55 ?49 21.8  R Ulnar - ADM     Wrist ADM 2.9 ?3.3 5.7 ?6.0 100 Wrist - ADM 7   15.2     B.Elbow ADM 5.8  4.6  81.1 B.Elbow - Wrist 16 55 ?49 11.5     A.Elbow ADM 7.6  4.4  95.7 A.Elbow - B.Elbow 10 56 ?49 11.7         A.Elbow - Wrist      L Ulnar - ADM     Wrist ADM 3.2 ?3.3 5.9 ?6.0 100 Wrist - ADM 7   22.6     B.Elbow ADM 6.1  5.2  88.4 B.Elbow - Wrist 16 55 ?49 21.8     A.Elbow ADM 7.9  5.1  97 A.Elbow - B.Elbow 10 56 ?49 21.7         A.Elbow - Wrist      R Radial - EIP     Forearm EIP 2.1 ?2.9 3.6 ?2.0 100 Forearm - EIP 4  ?49 18.4     Elbow EIP 4.3  3.8  106 Elbow - Forearm 14 64  17.9     Spiral Gr EIP 5.5  3.6  93.6 Spiral Gr - Elbow 8 67  16.0     Axilla EIP 6.8  3.9  107 Axilla - Spiral Gr 10 74  18.5  L Radial - EIP     Forearm EIP 2.2 ?2.9 3.2 ?2.0 100 Forearm - EIP 4  ?49 15.5     Elbow EIP 4.5  3.4  108 Elbow - Forearm 14 61  14.9     Spiral Gr EIP 6.3  3.3  96 Spiral Gr - Elbow 12 68  15.0     Axilla EIP 7.3  3.0  90.4 Axilla - Spiral Gr 8 73  12.8               SNC    Nerve / Sites Rec. Site Peak Lat Ref.  Amp Ref. Segments Distance    ms ms V V  cm  R Radial - Anatomical snuff box (Forearm)       Forearm Wrist 2.34 ?2.90 20 ?15 Forearm - Wrist 10  L Radial - Anatomical snuff box (Forearm)     Forearm Wrist 2.45 ?2.90 29 ?15 Forearm - Wrist 10  R Median - Orthodromic (Dig II, Mid palm)     Dig II Wrist 2.76 ?3.40 25 ?10 Dig II - Wrist 13  R Ulnar - Orthodromic, (Dig V, Mid palm)  Dig V Wrist 2.66 ?3.10 25 ?5 Dig V - Wrist 11  L Ulnar - Orthodromic, (Dig V, Mid palm)     Dig V Wrist 2.81 ?3.10 17 ?5 Dig V - Wrist 58               F  Wave    Nerve F Lat Ref.   ms ms  R Ulnar - ADM 26.6 ?32.0  L Ulnar - ADM 25.4 ?32.0         EMG full       EMG Summary Table    Spontaneous MUAP Recruitment  Muscle IA Fib PSW Fasc Other Amp Dur. Poly Pattern  L. Extensor digitorum communis Normal None None None _______ Normal Normal Normal Normal  L. Extensor indicis proprius Normal None None None _______ Normal Normal Normal Normal  L. Brachioradialis Normal None None None _______ Normal Normal Normal Normal  L. Deltoid Normal None None None _______ Normal Normal Normal Normal  L. Triceps brachii Normal None None None _______ Normal Normal Normal Normal  L. Pronator teres Normal None None None _______ Normal Normal Normal Normal  L. First dorsal interosseous Normal None None None _______ Normal Normal Normal Normal  L. Opponens pollicis Normal None None None _______ Normal Normal Normal Normal

## 2016-09-11 NOTE — Procedures (Signed)
Full Name: Lynn Holt Gender: Female MRN #: [id number] Date of Birth: 05/10/1976    Visit Date: 09/07/2016 08:08 Age: 41 Years 60 Months Old Examining Physician: Sarina Ill, MD  Referring Physician:  Lelon Huh, MD  Summary: All nerves and muscles (as indicated in the following tables) were within normal limits.      Conclusion: This is a normal study  Sarina Ill, M.D.  [facility] [place], [place] [address] Tel: [phone] Fax: [phone]        [facility]    Nerve / Sites Rec. Site Latency Ref. Amplitude Ref. Rel Amp Segments Distance Velocity Ref. Area    ms ms mV mV %  cm m/s m/s mVms  R Median - APB     Wrist APB 3.0 ?4.4 7.0 ?4.0 100 Wrist - APB 7   22.9     Upper arm APB 7.0  6.7  95.7 Upper arm - Wrist 22 55 ?49 21.8  R Ulnar - ADM     Wrist ADM 2.9 ?3.3 5.7 ?6.0 100 Wrist - ADM 7   15.2     B.Elbow ADM 5.8  4.6  81.1 B.Elbow - Wrist 16 55 ?49 11.5     A.Elbow ADM 7.6  4.4  95.7 A.Elbow - B.Elbow 10 56 ?49 11.7         A.Elbow - Wrist      L Ulnar - ADM     Wrist ADM 3.2 ?3.3 5.9 ?6.0 100 Wrist - ADM 7   22.6     B.Elbow ADM 6.1  5.2  88.4 B.Elbow - Wrist 16 55 ?49 21.8     A.Elbow ADM 7.9  5.1  97 A.Elbow - B.Elbow 10 56 ?49 21.7         A.Elbow - Wrist      R Radial - EIP     Forearm EIP 2.1 ?2.9 3.6 ?2.0 100 Forearm - EIP 4  ?49 18.4     Elbow EIP 4.3  3.8  106 Elbow - Forearm 14 64  17.9     Spiral Gr EIP 5.5  3.6  93.6 Spiral Gr - Elbow 8 67  16.0     Axilla EIP 6.8  3.9  107 Axilla - Spiral Gr 10 74  18.5  L Radial - EIP     Forearm EIP 2.2 ?2.9 3.2 ?2.0 100 Forearm - EIP 4  ?49 15.5     Elbow EIP 4.5  3.4  108 Elbow - Forearm 14 61  14.9     Spiral Gr EIP 6.3  3.3  96 Spiral Gr - Elbow 12 68  15.0     Axilla EIP 7.3  3.0  90.4 Axilla - Spiral Gr 8 73  12.8               SNC    Nerve / Sites Rec. Site Peak Lat Ref.  Amp Ref. Segments Distance    ms ms V V  cm  R Radial - Anatomical snuff box (Forearm)   Forearm Wrist 2.34 ?2.90 20 ?15 Forearm - Wrist 10  L Radial - Anatomical snuff box (Forearm)     Forearm Wrist 2.45 ?2.90 29 ?15 Forearm - Wrist 10  R Median - Orthodromic (Dig II, Mid palm)     Dig II Wrist 2.76 ?3.40 25 ?10 Dig II - Wrist 13  R Ulnar - Orthodromic, (Dig V, Mid palm)     Dig V Wrist 2.66 ?3.10 25 ?  5 Dig V - Wrist 11  L Ulnar - Orthodromic, (Dig V, Mid palm)     Dig V Wrist 2.81 ?3.10 17 ?5 Dig V - Wrist 72               F  Wave    Nerve F Lat Ref.   ms ms  R Ulnar - ADM 26.6 ?32.0  L Ulnar - ADM 25.4 ?32.0         EMG full       EMG Summary Table    Spontaneous MUAP Recruitment  Muscle IA Fib PSW Fasc Other Amp Dur. Poly Pattern  L. Extensor digitorum communis Normal None None None _______ Normal Normal Normal Normal  L. Extensor indicis proprius Normal None None None _______ Normal Normal Normal Normal  L. Brachioradialis Normal None None None _______ Normal Normal Normal Normal  L. Deltoid Normal None None None _______ Normal Normal Normal Normal  L. Triceps brachii Normal None None None _______ Normal Normal Normal Normal  L. Pronator teres Normal None None None _______ Normal Normal Normal Normal  L. First dorsal interosseous Normal None None None _______ Normal Normal Normal Normal  L. Opponens pollicis Normal None None None _______ Normal Normal Normal Normal

## 2016-09-12 ENCOUNTER — Ambulatory Visit: Payer: 59 | Admitting: Physical Therapy

## 2016-09-12 ENCOUNTER — Encounter: Payer: Self-pay | Admitting: Physical Therapy

## 2016-09-12 DIAGNOSIS — M79601 Pain in right arm: Secondary | ICD-10-CM

## 2016-09-12 DIAGNOSIS — R293 Abnormal posture: Secondary | ICD-10-CM | POA: Diagnosis not present
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
## 2016-09-13 NOTE — Telephone Encounter (Signed)
[employer] and [employer] forms completed and signed. Pt to return to work 10/09/16 after completion of PT and on light duty for a couple of weeks. Returned to medical records for payment/processing.

## 2016-09-14 ENCOUNTER — Ambulatory Visit: Payer: 59 | Admitting: Physical Therapy

## 2016-09-14 ENCOUNTER — Telehealth: Payer: Self-pay | Admitting: *Deleted

## 2016-09-14 ENCOUNTER — Encounter: Payer: Self-pay | Admitting: Physical Therapy

## 2016-09-14 DIAGNOSIS — R293 Abnormal posture: Secondary | ICD-10-CM

## 2016-09-14 DIAGNOSIS — M79601 Pain in right arm: Secondary | ICD-10-CM
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
## 2016-09-14 NOTE — Telephone Encounter (Signed)
Pt forms faxed to [employer] and [employer] on 09/14/16.

## 2016-09-19 ENCOUNTER — Ambulatory Visit: Payer: 59 | Admitting: Physical Therapy

## 2016-09-19 ENCOUNTER — Encounter: Payer: Self-pay | Admitting: Physical Therapy

## 2016-09-19 DIAGNOSIS — Z0289 Encounter for other administrative examinations: Secondary | ICD-10-CM

## 2016-09-19 DIAGNOSIS — R293 Abnormal posture: Secondary | ICD-10-CM

## 2016-09-19 DIAGNOSIS — M79601 Pain in right arm: Secondary | ICD-10-CM
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
## 2016-09-21 ENCOUNTER — Ambulatory Visit: Payer: 59 | Admitting: Physical Therapy

## 2016-09-21 ENCOUNTER — Encounter: Payer: Self-pay | Admitting: Physical Therapy

## 2016-09-21 DIAGNOSIS — M79601 Pain in right arm: Secondary | ICD-10-CM | POA: Diagnosis not present

## 2016-09-21 DIAGNOSIS — R293 Abnormal posture: Secondary | ICD-10-CM | POA: Diagnosis not present
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
## 2016-09-26 ENCOUNTER — Telehealth: Payer: Self-pay | Admitting: *Deleted

## 2016-09-26 ENCOUNTER — Ambulatory Visit: Payer: 59 | Attending: Neurology | Admitting: Physical Therapy

## 2016-09-26 ENCOUNTER — Encounter: Payer: Self-pay | Admitting: Physical Therapy

## 2016-09-26 DIAGNOSIS — M79601 Pain in right arm: Secondary | ICD-10-CM | POA: Diagnosis not present

## 2016-09-26 DIAGNOSIS — R293 Abnormal posture: Secondary | ICD-10-CM | POA: Insufficient documentation

## 2016-09-26 NOTE — Therapy (Signed)
[place] MAIN [facility] SERVICES [address] [place], [place], [address] Phone: [phone]   Fax:  [phone]  Physical Therapy Treatment  Patient Details  Name: Lynn Holt MRN: [id number] Date of Birth: [birth year] Referring Provider: Melvenia Beam, MD  Encounter Date: 09/26/2016      PT End of Session - 09/26/16 1630    Visit Number 14   Number of Visits 21   Date for PT Re-Evaluation 10/24/16   Authorization Type no g codes   PT Start Time 0330   PT Stop Time 0410   PT Time Calculation (min) 40 min   Activity Tolerance Patient tolerated treatment well;Patient limited by pain   Behavior During Therapy [facility] for tasks assessed/performed      Past Medical History:  Diagnosis Date  . Endometriosis   . Headache     Past Surgical History:  Procedure Laterality Date  . APPENDECTOMY    . labrium repair    . OVARIAN CYST REMOVAL    . SHOULDER SURGERY    . TONSILLECTOMY      There were no vitals filed for this visit.      Subjective Assessment - 09/26/16 1622    Subjective Patient reports intermittent pain that is very low and she is able to perform light household chores ; she is wearing a brace at night and is having less pain when she wakes up in the morning.    Currently in Pain? Yes   Pain Score 1    Pain Location Elbow   Pain Orientation Right   Pain Descriptors / Indicators Aching   Pain Type Chronic pain   Pain Radiating Towards not radiating   Pain Onset More than a month ago   Pain Frequency Intermittent   Aggravating Factors  lifting , pulling and typing   Pain Relieving Factors lyrica   Effect of Pain on Daily Activities pain is worse after activities   Multiple Pain Sites No       Manual therapy:   STM performed to right triceps muscle with edge tool using superficial and deep techniques with patient positioned in supine to decrease pain and spasms.   Patient has trigger areas in right triceps  muscles that decrease after STM.  Patient continues to have less pain and tenderness in right elbow and wrist extensor muscles following manual therapy 0/10 At end of session patient has no tenderness or trigger areas in right elbow. She reports less tingling in 5th digit.                             PT Education - 09/26/16 1626    Education provided Yes   Education Details ice and rest   Person(s) Educated Patient   Methods Explanation   Comprehension Verbalized understanding             PT Long Term Goals - 09/12/16 1322      PT LONG TERM GOAL #1   Title Pt will demonstrate at least 4+/5 strength with MMT throughout RUE to demonstrate improved strength and functional ability   Baseline 4/5 shoulder elbow and hand   Time 6   Period Weeks   Status On-going     PT LONG TERM GOAL #2   Title Pt's Quick Dash score will improve by 8 points to indicate an improvement in functional use of RUE   Time 6   Period Weeks  Status On-going     PT LONG TERM GOAL #3   Title Pt will report worst pain as 3/10 in RUE for improved QOL   Baseline 4/10 worst pain    Time 6   Period Weeks   Status On-going     PT LONG TERM GOAL #4   Title Pt will be able to complete all work duties painfree and report worst pain at end of work day as a 3/10 in [place] for improved QOL    Baseline pain with work activities 4/10    Time 6   Period Weeks   Status On-going     PT LONG TERM GOAL #5   Title Pt will demonstrate a negative Median and Radial ULTT to demonstrate improvement in neural involvement in RUE pain   Baseline positive   Time 6   Period Weeks   Status On-going               Plan - 09/26/16 1630    Clinical Impression Statement Patient is able to perform household activities with less pain and less tingling.  She responds to [facility] to right elbow with edge tool and has 0/10 pain following manual therapy. She will continue to benefit from skilled PT to  improve strength and decrease tingling and pain to right elbow, forearm, wrist and fingers   Rehab Potential Good   Clinical Impairments Affecting Rehab Potential (-) Chornic nature of pain and multisystem involvement (+) Pt with a medical background and very motivated to return to PLOF, age   PT Frequency 2x / week   PT Duration 6 weeks   PT Treatment/Interventions ADLs/Self Care Home Management;Cryotherapy;Electrical Stimulation;Iontophoresis 4mg /ml Dexamethasone;Moist Heat;Ultrasound;Functional mobility training;Therapeutic activities;Therapeutic exercise;Neuromuscular re-education;Patient/family education;Manual techniques;Passive range of motion;Dry needling;Taping   PT Next Visit Plan Cervical spine screen, initiate strengthening program, assess response to neural gliding HEP and progress as appropriate   PT Home Exercise Plan Median and radial neural glides via HEP2Go   Consulted and Agree with Plan of Care Patient      Patient will benefit from skilled therapeutic intervention in order to improve the following deficits and impairments:  Decreased range of motion, Decreased strength, Hypomobility, Increased fascial restricitons, Increased muscle spasms, Impaired perceived functional ability, Impaired flexibility, Impaired sensation, Impaired UE functional use, Improper body mechanics, Postural dysfunction, Pain  Visit Diagnosis: Pain in right arm  Abnormal posture     Problem List Patient Active Problem List   Diagnosis Date Noted  . Right arm weakness 07/13/2016   Alanson Puls, PT, DPT [place], [place] S 09/26/2016, 5:21 PM  [place] MAIN [facility] SERVICES [address] [address] [place], [place], [address] Phone: [phone]   Fax:  [phone]  Name: Lynn Holt MRN: [id number] Date of Birth: [birth year]

## 2016-09-26 NOTE — Telephone Encounter (Signed)
Pt form re faxed   to [employer] and [employer].

## 2016-09-28 ENCOUNTER — Encounter: Payer: Self-pay | Admitting: Physical Therapy

## 2016-09-28 ENCOUNTER — Ambulatory Visit: Payer: 59 | Admitting: Physical Therapy

## 2016-09-28 DIAGNOSIS — R293 Abnormal posture: Secondary | ICD-10-CM | POA: Diagnosis not present

## 2016-09-28 DIAGNOSIS — M79601 Pain in right arm: Secondary | ICD-10-CM
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
## 2016-10-02 ENCOUNTER — Ambulatory Visit: Payer: 59 | Admitting: Physical Therapy

## 2016-10-10 DIAGNOSIS — H43393 Other vitreous opacities, bilateral: Secondary | ICD-10-CM | POA: Diagnosis not present

## 2016-10-11 ENCOUNTER — Encounter: Payer: Self-pay | Admitting: Physical Therapy

## 2016-10-16 ENCOUNTER — Encounter: Payer: Self-pay | Admitting: Obstetrics & Gynecology

## 2016-10-16 ENCOUNTER — Ambulatory Visit (INDEPENDENT_AMBULATORY_CARE_PROVIDER_SITE_OTHER): Payer: 59 | Admitting: Obstetrics & Gynecology

## 2016-10-16 VITALS — BP 120/80 | HR 81 | Ht 62.0 in | Wt 158.0 lb

## 2016-10-16 DIAGNOSIS — Z0000 Encounter for general adult medical examination without abnormal findings: Secondary | ICD-10-CM

## 2016-10-16 DIAGNOSIS — Z1321 Encounter for screening for nutritional disorder: Secondary | ICD-10-CM

## 2016-10-16 DIAGNOSIS — Z124 Encounter for screening for malignant neoplasm of cervix: Secondary | ICD-10-CM

## 2016-10-16 DIAGNOSIS — Z1329 Encounter for screening for other suspected endocrine disorder: Secondary | ICD-10-CM

## 2016-10-16 DIAGNOSIS — Z01419 Encounter for gynecological examination (general) (routine) without abnormal findings: Secondary | ICD-10-CM | POA: Diagnosis not present

## 2016-10-16 DIAGNOSIS — Z1231 Encounter for screening mammogram for malignant neoplasm of breast: Secondary | ICD-10-CM

## 2016-10-16 DIAGNOSIS — N920 Excessive and frequent menstruation with regular cycle: Secondary | ICD-10-CM | POA: Diagnosis not present

## 2016-10-16 DIAGNOSIS — Z1239 Encounter for other screening for malignant neoplasm of breast: Secondary | ICD-10-CM

## 2016-10-16 DIAGNOSIS — Z13220 Encounter for screening for lipoid disorders: Secondary | ICD-10-CM

## 2016-10-16 MED ORDER — MEDROXYPROGESTERONE ACETATE 150 MG/ML IM SUSP: 150.0000 mg | INTRAMUSCULAR | 3 refills | Status: DC

## 2016-10-16 NOTE — Progress Notes (Signed)
HPI:      Lynn Holt is a 41 y.o. G0P0000 who LMP was Patient's last menstrual period was 09/16/2016., she presents today for her annual examination. The patient has no complaints today. The patient is sexually active. Her last pap: approximate date 2013 and was normal. The patient does perform self breast exams.  There is no notable family history of breast or ovarian cancer in her family.  The patient has regular exercise: yes.  The patient denies current symptoms of depression.    GYN History: Contraception: none  PMHx: Past Medical History:  Diagnosis Date  . Endometriosis   . Headache    Past Surgical History:  Procedure Laterality Date  . APPENDECTOMY    . labrium repair    . OVARIAN CYST REMOVAL    . SHOULDER SURGERY    . TONSILLECTOMY     Family History  Problem Relation Age of Onset  . Fibromyalgia Mother   . Rheum arthritis Father   . Heart attack Father   . Breast cancer Maternal Grandmother   . Colon cancer Maternal Grandmother   . Heart attack Paternal Grandfather   . Esophageal cancer Paternal Grandfather   . Neuropathy Neg Hx    Social History  Substance Use Topics  . Smoking status: Never Smoker  . Smokeless tobacco: Never Used  . Alcohol use Yes    Current Outpatient Prescriptions:  .  Cholecalciferol (VITAMIN D) 2000 units CAPS, Take by mouth., Disp: , Rfl:  .  medroxyPROGESTERone (DEPO-PROVERA) 150 MG/ML injection, Inject 1 mL (150 mg total) into the muscle every 3 (three) months., Disp: 1 mL, Rfl: 3 .  Multiple Vitamin (MULTIVITAMIN) capsule, Take 1 capsule by mouth daily., Disp: , Rfl:  .  pregabalin (LYRICA) 100 MG capsule, Take 1 capsule (100 mg total) by mouth 2 (two) times daily., Disp: 60 capsule, Rfl: 5 Allergies: Morphine and related and Ultram  [tramadol]  ROS  Objective: BP 120/80   Pulse 81   Ht 5\' 2"  (1.575 m)   Wt 158 lb (71.7 kg)   LMP 09/16/2016   BMI 28.90 kg/m   Filed Weights   10/16/16 1556  Weight: 158 lb  (71.7 kg)   Body mass index is 28.9 kg/m. OBGyn Exam  Assessment:  ANNUAL EXAM 1. Annual physical exam   2. Screening for breast cancer   3. Screening for cervical cancer   4. Screening for cholesterol level   5. Screening for thyroid disorder   6. Encounter for vitamin deficiency screening   7. Menorrhagia with regular cycle      Screening Plan:            1.  Cervical Screening-  Pap smear done today  2. Breast screening- Exam annually and mammogram>40 planned   3. Colonoscopy every 10 years, Hemoccult testing - after age 30  4. Labs Ordered today  5. Counseling for contraception: Depo-Provera  Other:  1. Annual physical exam  2. Screening for breast cancer - MM DIGITAL SCREENING BILATERAL; Future  3. Screening for cervical cancer - IGP, Aptima HPV  4. Screening for cholesterol level - Lipid panel  5. Screening for thyroid disorder - TSH  6. Encounter for vitamin deficiency screening - Vitamin D (25 hydroxy)  7. Menorrhagia with regular cycle - desires Depo, all options discussed.  Depo Provera chosen as therapy.  Counseled about the FDA warning about bone loss on Depo Provera taken more than 2 years. Losses in bone mineral density are  temporary and reverse after discontinuation of Depo Provera. There is no evidence of an increase in risk of fractures. Discussed other contraceptive methods, patient declined. Encouraged calcium (1200 mg daily) and vitamin D (800 IU daily) supplements to help with the risk of bone loss. Follow up in 3 months for repeat Depo Provera injection.  - medroxyPROGESTERone (DEPO-PROVERA) 150 MG/ML injection; Inject 1 mL (150 mg total) into the muscle every 3 (three) months.  Dispense: 1 mL; Refill: 3    F/U  Return in about 1 year (around 10/16/2017) for Annual.  Barnett Applebaum, MD, [facility], [facility] 10/16/2016  4:33 PM

## 2016-10-17 ENCOUNTER — Other Ambulatory Visit: Payer: 59

## 2016-10-17 DIAGNOSIS — Z1321 Encounter for screening for nutritional disorder: Secondary | ICD-10-CM | POA: Diagnosis not present

## 2016-10-17 DIAGNOSIS — Z1329 Encounter for screening for other suspected endocrine disorder: Secondary | ICD-10-CM | POA: Diagnosis not present

## 2016-10-17 DIAGNOSIS — Z13220 Encounter for screening for lipoid disorders: Secondary | ICD-10-CM | POA: Diagnosis not present

## 2016-10-18 LAB — LIPID PANEL
Chol/HDL Ratio: 2.6 ratio (ref 0.0–4.4)
Cholesterol, Total: 204 mg/dL — ABNORMAL HIGH (ref 100–199)
HDL: 79 mg/dL (ref >39)
LDL Calculated: 109 mg/dL — ABNORMAL HIGH (ref 0–99)
Triglycerides: 82 mg/dL (ref 0–149)
VLDL Cholesterol Cal: 16 mg/dL (ref 5–40)

## 2016-10-18 LAB — IGP, APTIMA HPV
HPV APTIMA: NEGATIVE
PAP SMEAR COMMENT: 0

## 2016-10-18 LAB — TSH: TSH: 2.23 u[IU]/mL (ref 0.450–4.500)

## 2016-10-18 LAB — VITAMIN D 25 HYDROXY (VIT D DEFICIENCY, FRACTURES): Vit D, 25-Hydroxy: 35.6 ng/mL (ref 30.0–100.0)

## 2016-12-07 ENCOUNTER — Ambulatory Visit: Payer: 59 | Admitting: Neurology

## 2016-12-07 ENCOUNTER — Telehealth: Payer: Self-pay

## 2016-12-07 NOTE — Telephone Encounter (Signed)
Called pt to let her know that if she's feeling better she doesn't need to come for this afternoon's appt per Dr. Jaynee Eagles who reviewed her notes from PT and feels she is doing well. However, she doesn't mind seeing her if she'd like to keep appt. Left VM mssg asking pt to call back to let us know.

## 2016-12-25 ENCOUNTER — Encounter: Payer: Self-pay | Admitting: Physician Assistant

## 2016-12-25 ENCOUNTER — Ambulatory Visit: Payer: Self-pay | Admitting: Physician Assistant

## 2016-12-25 VITALS — BP 110/84 | HR 96 | Temp 98.4°F

## 2016-12-25 DIAGNOSIS — J0100 Acute maxillary sinusitis, unspecified: Secondary | ICD-10-CM

## 2016-12-25 MED ORDER — AMOXICILLIN 875 MG PO TABS: 875.0000 mg | ORAL_TABLET | Freq: Two times a day (BID) | ORAL | 0 refills | Status: DC

## 2016-12-25 MED ORDER — PREDNISONE 10 MG PO TABS: 30.0000 mg | ORAL_TABLET | Freq: Every day | ORAL | 0 refills | Status: DC

## 2016-12-25 NOTE — Progress Notes (Signed)
S: C/o runny nose and congestion for 3 weeks, no fever, chills, cp/sob, v/d; mucus is green and thick, cough is sporadic, c/o of facial and dental pain. Has been flying a lot and ears are hurting  Using otc meds:   O: PE: vitals wnl, nad, perrl eomi, normocephalic, tms dull, nasal mucosa red and swollen, throat injected, neck supple no lymph, lungs c t a, cv rrr, neuro intact  A:  Acute sinusitis   P: drink fluids, continue regular meds , use otc meds of choice, return if not improving in 5 days, return earlier if worsening , amoxil, pred 30mg  qd x 3d

## 2017-04-25 ENCOUNTER — Encounter: Payer: Self-pay | Admitting: Neurology

## 2017-05-01 ENCOUNTER — Other Ambulatory Visit: Payer: Self-pay | Admitting: Neurology

## 2017-05-01 DIAGNOSIS — M542 Cervicalgia: Secondary | ICD-10-CM

## 2017-05-01 DIAGNOSIS — M7918 Myalgia, other site: Secondary | ICD-10-CM

## 2017-05-01 MED ORDER — DULOXETINE HCL 30 MG PO CPEP
30.0000 mg | ORAL_CAPSULE | Freq: Every day | ORAL | 6 refills | Status: DC
Start: 2017-05-01 — End: 2020-07-12

## 2017-05-08 ENCOUNTER — Other Ambulatory Visit: Payer: Self-pay | Admitting: Neurology

## 2017-05-08 DIAGNOSIS — M542 Cervicalgia: Secondary | ICD-10-CM

## 2017-05-08 DIAGNOSIS — M7918 Myalgia, other site: Secondary | ICD-10-CM

## 2017-05-31 ENCOUNTER — Encounter: Payer: Self-pay | Admitting: Neurology

## 2017-06-04 DIAGNOSIS — M25522 Pain in left elbow: Secondary | ICD-10-CM | POA: Diagnosis not present

## 2017-06-08 ENCOUNTER — Encounter: Payer: Self-pay | Admitting: Rehabilitative and Restorative Service Providers"

## 2017-06-08 ENCOUNTER — Other Ambulatory Visit: Payer: Self-pay

## 2017-06-08 ENCOUNTER — Ambulatory Visit: Payer: 59 | Attending: Neurology | Admitting: Rehabilitative and Restorative Service Providers"

## 2017-06-08 DIAGNOSIS — R293 Abnormal posture: Secondary | ICD-10-CM | POA: Diagnosis not present

## 2017-06-08 DIAGNOSIS — M542 Cervicalgia: Secondary | ICD-10-CM | POA: Insufficient documentation

## 2017-06-08 DIAGNOSIS — M79601 Pain in right arm: Secondary | ICD-10-CM | POA: Diagnosis not present

## 2017-06-08 NOTE — Patient Instructions (Signed)
Levator Stretch    Grasp seat or sit on hand on side to be stretched. Turn head toward other side and look down. Use hand on head to gently stretch neck in that position. Hold _20___ seconds. Repeat on other side. Repeat _3___ times. Do __2__ sessions per day.  http://gt2.exer.us/31   Copyright  VHI. All rights reserved.   Rib: Inferior Glide / Cervical Rotation    Holding towel or strap on top of right shoulder close to base of neck, pull down, rotating head to same side.  Hold for 10-15 seconds. Repeat __3__ times per set. Do _1___ sets per session.  Copyright  VHI. All rights reserved.   Neurovascular: Median Nerve Glide With Cervical Bias    Stand with right arm out to side, palm flat against wall, thumb up, elbow straight. Slowly move opposite side ear toward shoulder as far as possible without pain.  Hold 20 seconds. Repeat __3__ times per set. Do __1__ sets per session.   Copyright  VHI. All rights reserved.

## 2017-06-08 NOTE — Therapy (Signed)
[place] [address] [place], [place], [address] Phone: [phone]   Fax:  [phone]  Physical Therapy Evaluation  Patient Details  Name: Lynn Holt MRN: [id number] Date of Birth: Oct 11, 1975 Referring Provider: Heide Spark, MD   Encounter Date: 06/08/2017  PT End of Session - 06/08/17 1646    Visit Number  1    Number of Visits  8    Date for PT Re-Evaluation  07/08/17    Authorization Type  UMR    PT Start Time  1408    PT Stop Time  1448    PT Time Calculation (min)  40 min    Activity Tolerance  Patient tolerated treatment well    Behavior During Therapy  [facility] for tasks assessed/performed       Past Medical History:  Diagnosis Date  . Endometriosis   . Headache     Past Surgical History:  Procedure Laterality Date  . APPENDECTOMY    . labrium repair    . OVARIAN CYST REMOVAL    . SHOULDER SURGERY    . TONSILLECTOMY      There were no vitals filed for this visit.   Subjective Assessment - 06/08/17 1411    Subjective  The patient reports onset of R hand pain and numbness progressing proximally about 2 years ago.  She notes that her orthopedic doctor referred her to neurology.  She underwent OP rehab up through 08/2016 with significant improvement.  She returns to therapy today after requesting PT due to R arm/neck pain.  She points to levator scapulae region as area of concern.    She also notes radiating discomfort to scapula.      Pertinent History  h/o Migraines (worse with stress or seasonal allergies),  h/o L RTC tear with surgery, R shoulder labral tear.    Patient Stated Goals  Relieve the pain in the R neck/shoulder region.    Currently in Pain?  Yes    Pain Score  4  "today is not bad"-- goes up to 8-9/10 when bad.    Pain Location  Shoulder    Pain Orientation  Right    Pain Descriptors / Indicators  Aching;Tightness    Pain Type  Chronic pain    Pain Onset  More than a month ago     Pain Frequency  Intermittent    Aggravating Factors   worse in the last couple of months    Pain Relieving Factors  nothing         [facility] PT Assessment - 06/08/17 1420      Assessment   Medical Diagnosis  myofascial neck pain    Referring Provider  Heide Spark, MD    Hand Dominance  Right    Prior Therapy  Has had prior PT (most recently at [facility] in 08/2016)      Precautions   Precautions  None      Restrictions   Weight Bearing Restrictions  No      Balance Screen   Has the patient fallen in the past 6 months  No    Has the patient had a decrease in activity level because of a fear of falling?   No    Is the patient reluctant to leave their home because of a fear of falling?   No      Home Film/video editor residence    Living Arrangements  Spouse/significant other  Type of Home  House    Home Access  Level entry    Home Layout  Two level    Home Equipment  None      Prior Function   Level of Independence  Independent    Vocation  Full time employment    Vocation Requirements  lifting, reaching overhead    Leisure  hiking, reading, working      Cognition   Overall Cognitive Status  Within Functional Limits for tasks assessed      ROM / Strength   AROM / PROM / Strength  AROM;Strength      AROM   Overall AROM   Within functional limits for tasks performed    Overall AROM Comments  neck AROM is within full limits; shoulder ROM within full limits      Strength   Strength Assessment Site  Shoulder;Elbow    Right/Left Shoulder  Left;Right    Right Shoulder Flexion  5/5    Right Shoulder ABduction  5/5    Left Shoulder Flexion  5/5    Left Shoulder ABduction  5/5    Right/Left Elbow  Right;Left    Right Elbow Flexion  5/5    Right Elbow Extension  -- modified due to recent ulnar injury-"I rolled it last week"    Left Elbow Flexion  5/5    Left Elbow Extension  4/5      Palpation   Palpation comment  Tender to palpation of  right levator scapulae, scalenes, and along medial border of scapula      Special Tests    Special Tests  Cervical    Other special tests  Joint mobility:  Noted tightness in T2-T4 region with P>A joint moiblity assessment.  Tender over lateral facet of R cervical spine with palpation.      Cervical Tests  -- Neck flexion endurance test 20 sec with overuse of SCM             Objective measurements completed on examination: See above findings.      [place] Adult PT Treatment/Exercise - 06/08/17 1438      Exercises   Exercises  Other Exercises    Other Exercises   home program established:  neural glides in door frame for R UE, levator stretch, shoulder depression with belt.              PT Education - 06/08/17 1645    Education provided  Yes    Education Details  leavator scapulae stretch, neural stretch UE, rib depression R    Person(s) Educated  Patient    Methods  Explanation;Demonstration;Handout    Comprehension  Verbalized understanding;Returned demonstration          PT Long Term Goals - 06/08/17 1649      PT LONG TERM GOAL #1   Title  The patient will return demo HEP for cervical stretching, postural strengthening/stabilization and UE strengthening.    Time  4    Period  Weeks    Target Date  07/08/17      PT LONG TERM GOAL #2   Title  The patient will reduce subjective pain at rest from 4/10 to 0/10 at rest.    Time  4    Period  Weeks    Target Date  07/08/17      PT LONG TERM GOAL #3   Title  The patient will report pain at worst is 4/10 (reducing from 8/10).    Time  4    Period  Weeks    Target Date  07/08/17      PT LONG TERM GOAL #4   Title  The patient will improve neck flexion endurance test to 30 seconds to demo improving endurance of deep neck flexors.    Time  4    Period  Weeks    Target Date  07/08/17      PT LONG TERM GOAL #5   Title  The patient will verbalize understanding of return to gym routine/ UE and upper back  strengthening for long term.    Time  4    Period  Weeks    Target Date  07/08/17             Plan - 06/08/17 1651    Clinical Impression Statement  The patient is a 42 year old female presenting to OP PT with R neck/shoulder pain ranging from 4/10 at rest to 8/10 at worst.  The patient presents with tightness and trigger points noted along R levator, R scalenes.  She has decreased enduranced of deep neck flexors with testing and dec'd upper thoracic joint mobility.  PT to address deficits in order to reduce pain and facilitate return to community strengthening program.     Clinical Presentation  Stable    Clinical Decision Making  Low    Rehab Potential  Good    PT Frequency  2x / week    PT Duration  4 weeks    PT Treatment/Interventions  ADLs/Self Care Home Management;Patient/family education;Dry needling;Manual techniques;Therapeutic exercise;Therapeutic activities;Neuromuscular re-education;Moist Heat;Cryotherapy    PT Next Visit Plan  Dry needling right levator, R scalenes and surrounding musculature as indicated.  Postural strengthening, stretching, return to community strengthening program with guidance.  Work Orthoptist with Plan of Care  Patient       Patient will benefit from skilled therapeutic intervention in order to improve the following deficits and impairments:  Pain, Impaired UE functional use, Impaired flexibility, Postural dysfunction, Hypomobility  Visit Diagnosis: Cervicalgia  Abnormal posture     Problem List Patient Active Problem List   Diagnosis Date Noted  . Right arm weakness 07/13/2016    Yael Coppess, PT 06/08/2017, 4:56 PM  [place] [address] [place], [place], [address] Phone: [phone]   Fax:  [phone]  Name: Lynn Holt MRN: [id number] Date of Birth: 18-Jun-1975

## 2017-06-19 ENCOUNTER — Encounter: Payer: Self-pay | Admitting: Physical Therapy

## 2017-06-19 ENCOUNTER — Ambulatory Visit: Payer: 59 | Admitting: Physical Therapy

## 2017-06-19 DIAGNOSIS — R293 Abnormal posture: Secondary | ICD-10-CM

## 2017-06-19 DIAGNOSIS — M542 Cervicalgia: Secondary | ICD-10-CM

## 2017-06-19 DIAGNOSIS — M79601 Pain in right arm: Secondary | ICD-10-CM

## 2017-06-19 NOTE — Therapy (Signed)
[place], [place], [address] Phone: [phone]   Fax:  [phone]  Physical Therapy Treatment  Patient Details  Name: CHAYANNE FILIPPI MRN: [id number] Date of Birth: July 24, 1975 Referring Provider: Heide Spark, MD   Encounter Date: 06/19/2017  PT End of Session - 06/19/17 1014    Visit Number  2    Number of Visits  8    Date for PT Re-Evaluation  07/08/17    Authorization Type  UMR    PT Start Time  1016    PT Stop Time  1059    PT Time Calculation (min)  43 min    Activity Tolerance  Patient tolerated treatment well    Behavior During Therapy  [facility] for tasks assessed/performed       Past Medical History:  Diagnosis Date  . Endometriosis   . Headache     Past Surgical History:  Procedure Laterality Date  . APPENDECTOMY    . labrium repair    . OVARIAN CYST REMOVAL    . SHOULDER SURGERY    . TONSILLECTOMY      There were no vitals filed for this visit.  Subjective Assessment - 06/19/17 1020    Subjective  " I am doing about the same since the last session, been consistent with the HEP which she reports it helps"     Currently in Pain?  Yes    Pain Score  4     Pain Location  Shoulder    Pain Orientation  Right    Pain Descriptors / Indicators  Aching;Tightness    Pain Type  Chronic pain    Pain Onset  More than a month ago    Pain Frequency  Intermittent    Aggravating Factors   repetitive motions    Pain Relieving Factors  stretching, and heating.                       [place] Adult PT Treatment/Exercise - 06/19/17 1016      Manual Therapy   Manual Therapy  Soft tissue mobilization;Joint mobilization;Taping    Manual therapy comments  skilled palpation and monitoring pt throughout DN.    Joint Mobilization  Grade 3 PA T1-T7 and R first rib mobs with pt controlled breathing in and out    Soft tissue mobilization  IASTM over R upper trap/ levator scapulae    McConnell  upper trap inhibition taping       Trigger Point Dry Needling - 06/19/17 1016    Consent Given?  Yes    Education Handout Provided  Yes    Muscles Treated Upper Body  Upper trapezius;Levator scapulae R middle scalenes    Upper Trapezius Response  Twitch reponse elicited;Palpable increased muscle length    Levator Scapulae Response  Twitch response elicited;Palpable increased muscle length           PT Education - 06/19/17 1015    Education provided  Yes    Education Details  reviewed previously provided HEP. muscle anatomy and referral patterns. What TPDN, benefits, what to expect and after care. updated HEP.     Person(s) Educated  Patient    Methods  Explanation;Verbal cues;Handout    Comprehension  Verbalized understanding;Verbal cues required          PT Long Term Goals - 06/08/17 1649      PT LONG TERM GOAL #1   Title  The patient will return demo  HEP for cervical stretching, postural strengthening/stabilization and UE strengthening.    Time  4    Period  Weeks    Target Date  07/08/17      PT LONG TERM GOAL #2   Title  The patient will reduce subjective pain at rest from 4/10 to 0/10 at rest.    Time  4    Period  Weeks    Target Date  07/08/17      PT LONG TERM GOAL #3   Title  The patient will report pain at worst is 4/10 (reducing from 8/10).    Time  4    Period  Weeks    Target Date  07/08/17      PT LONG TERM GOAL #4   Title  The patient will improve neck flexion endurance test to 30 seconds to demo improving endurance of deep neck flexors.    Time  4    Period  Weeks    Target Date  07/08/17      PT LONG TERM GOAL #5   Title  The patient will verbalize understanding of return to gym routine/ UE and upper back strengthening for long term.    Time  4    Period  Weeks    Target Date  07/08/17            Plan - 06/19/17 1059    Clinical Impression Statement  pt reported 4/10 pain today and reprots consistency with her HEP.  Educated and performed DN along R upper trap/ levator scapulae and middle scalenes. soft tisssue techniques and inhibition taping performed which she reproted signficant relief of pain and tightness post session. updated HEP for upper trap stretching and thoracic mobility. plan to schedule 1 x a week for the next 4 weeks to continue DN and exercise. She declined modalites post session.     PT Next Visit Plan  response to DN, lower trap activation, thoracic mobility, try foam roll routine, IASTM techniques, modalities PRN    PT Home Exercise Plan  rib mobs, levator scap stretch, nerve glides, book opening, upper trap stretch.     Consulted and Agree with Plan of Care  Patient       Patient will benefit from skilled therapeutic intervention in order to improve the following deficits and impairments:  Pain, Impaired UE functional use, Impaired flexibility, Postural dysfunction, Hypomobility  Visit Diagnosis: Cervicalgia  Abnormal posture  Pain in right arm     Problem List Patient Active Problem List   Diagnosis Date Noted  . Right arm weakness 07/13/2016   Starr Lake PT, DPT, LAT, ATC  06/19/17  11:03 AM      [facility] [address] [place], [place], [address] Phone: [phone]   Fax:  [phone]  Name: TRACHELLE LOW MRN: [id number] Date of Birth: 1976/02/04

## 2017-07-01 ENCOUNTER — Encounter: Payer: Self-pay | Admitting: Nurse Practitioner

## 2017-07-01 ENCOUNTER — Ambulatory Visit: Payer: Self-pay | Admitting: Nurse Practitioner

## 2017-07-01 VITALS — BP 110/82 | HR 86 | Temp 98.5°F | Wt 153.4 lb

## 2017-07-01 DIAGNOSIS — J0140 Acute pansinusitis, unspecified: Secondary | ICD-10-CM

## 2017-07-01 MED ORDER — FLUTICASONE PROPIONATE 50 MCG/ACT NA SUSP: 2.0000 | Freq: Every day | NASAL | 0 refills | Status: DC

## 2017-07-01 MED ORDER — AMOXICILLIN-POT CLAVULANATE 875-125 MG PO TABS: 1.0000 | ORAL_TABLET | Freq: Two times a day (BID) | ORAL | 0 refills | Status: AC

## 2017-07-01 NOTE — Progress Notes (Signed)
Subjective:  Lynn Holt is a 42 y.o. female who presents for evaluation of possible sinusitis.  Symptoms include facial pain, headache described as pressure, nasal congestion, post nasal drip, sinus pressure, sinus pain, sneezing, sore throat and tooth pain.  Onset of symptoms was 5 days ago, and has been rapidly worsening since that time.  Treatment to date:  acetaminophen and ibuprofen.  High risk factors for influenza complications:  none.  The following portions of the patient's history were reviewed and updated as appropriate:  allergies, current medications and past medical history.  Constitutional: positive for fatigue and fevers, negative for anorexia, chills and malaise Eyes: positive for redness and right eye swelling, negative for icterus, irritation and visual disturbance Ears, nose, mouth, throat, and face: positive for nasal congestion, sore throat and ear pressure/fullness, negative for ear drainage, hoarseness and tinnitus Respiratory: positive for cough, negative for dyspnea on exertion, sputum, stridor and wheezing Cardiovascular: negative Gastrointestinal: negative Neurological: positive for headaches, negative for coordination problems, paresthesia, vertigo and weakness Allergic/Immunologic: positive for hay fever Objective:  BP 110/82   Pulse 86   Temp 98.5 F (36.9 C)   Wt 153 lb 6.4 oz (69.6 kg)   SpO2 98%   BMI 28.06 kg/m  General appearance: alert, cooperative and no distress Head: Normocephalic, without obvious abnormality, atraumatic Eyes: positive findings: eyelids/periorbital: edema, right Ears: abnormal TM right ear - mucoid middle ear fluid Nose: no discharge, turbinates swollen, inflamed, moderate maxillary sinus tenderness right, mild frontal sinus tenderness right Throat: lips, mucosa, and tongue normal; teeth and gums normal Lungs: clear to auscultation bilaterally Heart: regular rate and rhythm, S1, S2 normal, no murmur, click, rub or  gallop Abdomen: soft, non-tender; bowel sounds normal; no masses,  no organomegaly Pulses: 2+ and symmetric Skin: Skin color, texture, turgor normal. No rashes or lesions Lymph nodes: cervical lymphadenopathy Neurologic: Grossly normal    Assessment:  Acute Pansinusitis    Plan:  Discussed diagnosis and treatment of sinusitis. Educational material distributed and questions answered. Suggested symptomatic OTC remedies. Supportive care with appropriate antipyretics and fluids. Augmentin per orders. Nasal steroids per orders. Follow up as needed.

## 2017-07-01 NOTE — Patient Instructions (Addendum)

## 2017-07-03 ENCOUNTER — Encounter: Payer: Self-pay | Admitting: Physical Therapy

## 2017-07-03 ENCOUNTER — Ambulatory Visit: Payer: 59 | Attending: Neurology | Admitting: Physical Therapy

## 2017-07-03 DIAGNOSIS — M542 Cervicalgia: Secondary | ICD-10-CM | POA: Insufficient documentation

## 2017-07-03 DIAGNOSIS — R293 Abnormal posture: Secondary | ICD-10-CM | POA: Insufficient documentation

## 2017-07-03 DIAGNOSIS — M79601 Pain in right arm: Secondary | ICD-10-CM | POA: Insufficient documentation

## 2017-07-03 NOTE — Therapy (Signed)
[place], [place], [address] Phone: [phone]   Fax:  [phone]  Physical Therapy Evaluation  Patient Details  Name: Lynn Holt MRN: [id number] Date of Birth: 08-17-1975 Referring Provider: Heide Spark, MD   Encounter Date: 07/03/2017  PT End of Session - 07/03/17 1548    Visit Number  3    Number of Visits  8    Date for PT Re-Evaluation  07/08/17    PT Start Time  1548    PT Stop Time  1627    PT Time Calculation (min)  39 min    Activity Tolerance  Patient tolerated treatment well    Behavior During Therapy  [facility] for tasks assessed/performed       Past Medical History:  Diagnosis Date  . Endometriosis   . Headache     Past Surgical History:  Procedure Laterality Date  . APPENDECTOMY    . labrium repair    . OVARIAN CYST REMOVAL    . SHOULDER SURGERY    . TONSILLECTOMY      There were no vitals filed for this visit.   Subjective Assessment - 07/03/17 1544    Subjective  "the last session was good, I am fighting a sinus infection and it is affecting my R side"     Currently in Pain?  Yes    Pain Score  3     Pain Orientation  Right    Pain Descriptors / Indicators  Aching    Pain Onset  More than a month ago    Pain Frequency  Intermittent    Aggravating Factors   repetitive motions.     Pain Relieving Factors  stretching, and heating                 Objective measurements completed on examination: See above findings.      [facility] Adult PT Treatment/Exercise - 07/03/17 1558      Neck Exercises: Supine   Other Supine Exercise  foam roll routine: ceiling punches, horizontal abduction/add, alternating ceiling punches, back stroke 1 x 10 each      Manual Therapy   Manual therapy comments  skilled palpation and monitoring pt throughout DN.    Joint Mobilization  Grade 3 PA T1-T7 and R first rib mobs with pt controlled breathing in and out    Soft tissue  mobilization  IASTM over R upper trap/ levator scapulae    McConnell  upper trap inhibition taping      Neck Exercises: Stretches   Upper Trapezius Stretch  2 reps;30 seconds PNF contract / relax with 10 sec hold       Trigger Point Dry Needling - 07/03/17 1557    Consent Given?  Yes    Education Handout Provided  -- given previously    Muscles Treated Upper Body  Sternocleidomastoid scalenes on the R    Sternocleidomastoid Response  Twitch response elicited;Palpable increased muscle length R onlt    Upper Trapezius Response  Palpable increased muscle length;Twitch reponse elicited    Levator Scapulae Response  Twitch response elicited;Palpable increased muscle length R only                PT Long Term Goals - 06/08/17 1649      PT LONG TERM GOAL #1   Title  The patient will return demo HEP for cervical stretching, postural strengthening/stabilization and UE strengthening.    Time  4    Period  Weeks    Target Date  07/08/17      PT LONG TERM GOAL #2   Title  The patient will reduce subjective pain at rest from 4/10 to 0/10 at rest.    Time  4    Period  Weeks    Target Date  07/08/17      PT LONG TERM GOAL #3   Title  The patient will report pain at worst is 4/10 (reducing from 8/10).    Time  4    Period  Weeks    Target Date  07/08/17      PT LONG TERM GOAL #4   Title  The patient will improve neck flexion endurance test to 30 seconds to demo improving endurance of deep neck flexors.    Time  4    Period  Weeks    Target Date  07/08/17      PT LONG TERM GOAL #5   Title  The patient will verbalize understanding of return to gym routine/ UE and upper back strengthening for long term.    Time  4    Period  Weeks    Target Date  07/08/17             Plan - 07/03/17 1627    Clinical Impression Statement  3/10 pain starting todays session. continued DN for  R upper trap/ levator scapulae, scalenes and SCM. Cotninued soft tissue techniques and mobs  which she reported relief of pain/ tightness. continued thoracic/ shoulder mobility exercise and taping which she continues to report decreased pain post session.    PT Next Visit Plan  ERO, response to DN, lower trap activation, thoracic mobility, try foam roll routine, IASTM techniques, modalities PRN, update HEP for foam roll routine.     PT Home Exercise Plan  rib mobs, levator scap stretch, nerve glides, book opening, upper trap stretch.     Consulted and Agree with Plan of Care  Patient       Patient will benefit from skilled therapeutic intervention in order to improve the following deficits and impairments:  Pain, Impaired UE functional use, Impaired flexibility, Postural dysfunction, Hypomobility  Visit Diagnosis: Cervicalgia  Abnormal posture  Pain in right arm     Problem List Patient Active Problem List   Diagnosis Date Noted  . Right arm weakness 07/13/2016   Starr Lake PT, DPT, LAT, ATC  07/03/17  4:39 PM      [facility] [address] [place], [place], [address] Phone: [phone]   Fax:  [phone]  Name: Lynn Holt MRN: [id number] Date of Birth: Jul 05, 1975

## 2017-07-05 ENCOUNTER — Telehealth: Payer: Self-pay

## 2017-07-10 ENCOUNTER — Encounter: Payer: Self-pay | Admitting: Physical Therapy

## 2017-07-10 ENCOUNTER — Ambulatory Visit: Payer: 59 | Admitting: Physical Therapy

## 2017-07-10 DIAGNOSIS — R293 Abnormal posture: Secondary | ICD-10-CM

## 2017-07-10 DIAGNOSIS — M542 Cervicalgia: Secondary | ICD-10-CM | POA: Diagnosis not present

## 2017-07-10 DIAGNOSIS — M79601 Pain in right arm: Secondary | ICD-10-CM

## 2017-07-10 DIAGNOSIS — M25522 Pain in left elbow: Secondary | ICD-10-CM | POA: Diagnosis not present

## 2017-07-10 NOTE — Addendum Note (Signed)
Addended by: Larey Days on: 07/10/2017 05:34 PM   Modules accepted: Orders

## 2017-07-10 NOTE — Therapy (Signed)
[place], [place], [address] Phone: [phone]   Fax:  [phone]  Physical Therapy Treatment / Re-assessment   Patient Details  Name: Lynn Holt MRN: [id number] Date of Birth: [birth year] Referring Provider: Heide Spark, MD   Encounter Date: 07/10/2017  PT End of Session - 07/10/17 1549    Visit Number  4    Number of Visits  8    Date for PT Re-Evaluation  08/07/17    PT Start Time  4008    PT Stop Time  1630    PT Time Calculation (min)  43 min    Activity Tolerance  Patient tolerated treatment well    Behavior During Therapy  [facility] for tasks assessed/performed       Past Medical History:  Diagnosis Date  . Endometriosis   . Headache     Past Surgical History:  Procedure Laterality Date  . APPENDECTOMY    . labrium repair    . OVARIAN CYST REMOVAL    . SHOULDER SURGERY    . TONSILLECTOMY      There were no vitals filed for this visit.  Subjective Assessment - 07/10/17 1549    Subjective  "It is getting looser, pain is improving. what I have is more local"    Currently in Pain?  Yes    Pain Score  2     Pain Orientation  Right    Pain Descriptors / Indicators  Aching    Pain Type  Chronic pain    Aggravating Factors   repetitive motions    Pain Relieving Factors  stretching, and heating         [facility] PT Assessment - 07/10/17 0001      AROM   Overall AROM Comments  neck AROM is within full limits; shoulder ROM within full limits                  [facility] Adult PT Treatment/Exercise - 07/10/17 1601      Neck Exercises: Seated   Other Seated Exercise  lower trap strengthening 2 x 10 with blue theraband      Neck Exercises: Supine   Other Supine Exercise  foam roll routine: ceiling punches, horizontal abduction/add, alternating ceiling punches, back stroke 1 x 10 each      Modalities   Modalities  Ultrasound      Ultrasound   Ultrasound Location  R upper  trap    Ultrasound Parameters  8 min, 48mhz @ 1.0 W/cm2 100%    Ultrasound Goals  Pain      Manual Therapy   Manual therapy comments  skilled palpation and monitoring pt throughout DN.    Joint Mobilization  Grade 3 PA T1-T7 and R first rib mobs with pt controlled breathing in and out    Soft tissue mobilization  IASTM over R upper trap/ levator scapulae    McConnell  upper trap inhibition taping      Neck Exercises: Stretches   Upper Trapezius Stretch  2 reps;30 seconds    Other Neck Stretches  rhomboid stretch 2 x30 sec       Trigger Point Dry Needling - 07/10/17 1600    Consent Given?  Yes    Education Handout Provided  Yes    Upper Trapezius Response  Twitch reponse elicited;Palpable increased muscle length    Levator Scapulae Response  Twitch response elicited;Palpable increased muscle length  PT Education - 07/10/17 1728    Education provided  Yes    Education Details  updated HEP for foam roll routine.    Person(s) Educated  Patient    Methods  Explanation;Verbal cues;Handout    Comprehension  Verbalized understanding;Verbal cues required          PT Long Term Goals - 07/10/17 1552      PT LONG TERM GOAL #1   Title  The patient will return demo HEP for cervical stretching, postural strengthening/stabilization and UE strengthening.    Time  4    Period  Weeks    Status  Achieved      PT LONG TERM GOAL #2   Title  The patient will reduce subjective pain at rest from 4/10 to 0/10 at rest.    Baseline  2/10 pain today    Time  4    Period  Weeks    Status  On-going      PT LONG TERM GOAL #3   Title  The patient will report pain at worst is 4/10 (reducing from 8/10).    Baseline  4/10 worst pain     Period  Weeks    Status  Achieved      PT LONG TERM GOAL #4   Title  The patient will improve neck flexion endurance test to 30 seconds to demo improving endurance of deep neck flexors.    Time  4    Period  Weeks    Status  Achieved      PT  LONG TERM GOAL #5   Title  The patient will verbalize understanding of return to gym routine/ UE and upper back strengthening for long term.    Time  4    Period  Weeks    Status  Achieved            Plan - 07/10/17 1726    Clinical Impression Statement  pt reports continued soreness in the r shoulder but reported that it has localized to one spot. Continued DN over the R upper trap followed with [place] and IASTM techniques. She is progressing well with goals. plan to continued 1 x a week for the next 3-4 weeks working on scapular stability, and posture/ mechanics and finalize HEP.     Rehab Potential  Good    PT Frequency  1x / week    PT Duration  4 weeks    PT Treatment/Interventions  ADLs/Self Care Home Management;Patient/family education;Dry needling;Manual techniques;Therapeutic exercise;Therapeutic activities;Neuromuscular re-education;Moist Heat;Cryotherapy;Iontophoresis 4mg /ml Dexamethasone    PT Next Visit Plan  response to DN, lower trap activation, thoracic mobility,, IASTM techniques, modalities PRN, scapular stability and posture    PT Home Exercise Plan  rib mobs, levator scap stretch, nerve glides, book opening, upper trap stretch. foam roll rountine    Consulted and Agree with Plan of Care  Patient       Patient will benefit from skilled therapeutic intervention in order to improve the following deficits and impairments:  Pain, Impaired UE functional use, Impaired flexibility, Postural dysfunction, Hypomobility  Visit Diagnosis: Cervicalgia  Abnormal posture  Pain in right arm     Problem List Patient Active Problem List   Diagnosis Date Noted  . Right arm weakness 07/13/2016   Starr Lake PT, DPT, LAT, ATC  07/10/17  5:31 PM      [facility] [address] [place], [place], [address] Phone: [phone]   Fax:  [phone]  Name: Lynn Holt MRN: [id number] Date of Birth: [birth year]

## 2017-07-12 DIAGNOSIS — M25522 Pain in left elbow: Secondary | ICD-10-CM | POA: Diagnosis not present

## 2017-07-17 ENCOUNTER — Encounter: Payer: Self-pay | Admitting: Physical Therapy

## 2017-07-17 ENCOUNTER — Ambulatory Visit: Payer: 59 | Admitting: Physical Therapy

## 2017-07-17 DIAGNOSIS — R293 Abnormal posture: Secondary | ICD-10-CM

## 2017-07-17 DIAGNOSIS — M542 Cervicalgia: Secondary | ICD-10-CM

## 2017-07-17 DIAGNOSIS — M79601 Pain in right arm: Secondary | ICD-10-CM

## 2017-07-17 NOTE — Therapy (Signed)
[place], [place], [address] Phone: [phone]   Fax:  [phone]  Physical Therapy Treatment  Patient Details  Name: Lynn Holt MRN: [id number] Date of Birth: 06-15-1975 Referring Provider: Heide Spark, MD   Encounter Date: 07/17/2017  PT End of Session - 07/17/17 1353    Visit Number  5    Number of Visits  8    Date for PT Re-Evaluation  08/07/17    Authorization Type  UMR    PT Start Time  7858    PT Stop Time  1443    PT Time Calculation (min)  50 min    Activity Tolerance  Patient tolerated treatment well    Behavior During Therapy  [facility] for tasks assessed/performed       Past Medical History:  Diagnosis Date  . Endometriosis   . Headache     Past Surgical History:  Procedure Laterality Date  . APPENDECTOMY    . labrium repair    . OVARIAN CYST REMOVAL    . SHOULDER SURGERY    . TONSILLECTOMY      There were no vitals filed for this visit.  Subjective Assessment - 07/17/17 1352    Subjective  "I feeling pretty good, I am only having a small about of soreness mostly just just stiffness"     Currently in Pain?  Yes    Pain Score  1     Pain Location  Shoulder    Pain Orientation  Right    Pain Descriptors / Indicators  Aching    Pain Type  Chronic pain    Pain Frequency  Intermittent    Aggravating Factors   repetitive motion    Pain Relieving Factors  stretching, and heating                      [facility] Adult PT Treatment/Exercise - 07/17/17 1400      Self-Care   Self-Care  Other Self-Care Comments    Other Self-Care Comments   MTPR using theracane and benefits      Neck Exercises: Supine   Other Supine Exercise  Foam roll thoracic 2 x 10 using black roller breathing out with extension and in with flexion    Other Supine Exercise  scapular series, horizontal abduction, narrow grip sustained horizontal abduction with flexion/ extension  2 x 12 with green  theraband, 2 x 12 with wide grip, 2 x 12 with green theraband ER and scapular retraction, Y with band wrapped around legs 1 x12       Shoulder Exercises: Prone   Other Prone Exercises  I's T's Y's 2 x 12  2#      Shoulder Exercises: ROM/Strengthening   UBE (Upper Arm Bike)  L2 x 6 min changing direction at 3 min      Modalities   Modalities  Moist Heat      Moist Heat Therapy   Number Minutes Moist Heat  10 Minutes    Moist Heat Location  Cervical in supine      Manual Therapy   Manual therapy comments  MTPR along the R upper trap       Neck Exercises: Stretches   Upper Trapezius Stretch  1 rep;30 seconds;Right    Levator Stretch  1 rep;Right;30 seconds                  PT Long Term Goals - 07/10/17 1552  PT LONG TERM GOAL #1   Title  The patient will return demo HEP for cervical stretching, postural strengthening/stabilization and UE strengthening.    Time  4    Period  Weeks    Status  Achieved      PT LONG TERM GOAL #2   Title  The patient will reduce subjective pain at rest from 4/10 to 0/10 at rest.    Baseline  2/10 pain today    Time  4    Period  Weeks    Status  On-going      PT LONG TERM GOAL #3   Title  The patient will report pain at worst is 4/10 (reducing from 8/10).    Baseline  4/10 worst pain     Period  Weeks    Status  Achieved      PT LONG TERM GOAL #4   Title  The patient will improve neck flexion endurance test to 30 seconds to demo improving endurance of deep neck flexors.    Time  4    Period  Weeks    Status  Achieved      PT LONG TERM GOAL #5   Title  The patient will verbalize understanding of return to gym routine/ UE and upper back strengthening for long term.    Time  4    Period  Weeks    Status  Achieved            Plan - 07/17/17 1433    Clinical Impression Statement  pt reports only 1/10 pain reported mostly as stiffness. Educated how to decrease tightness in the R upper trap using tools. continued  scapular strengthening which she performed well with no report of sorness. post session she reported pain was maybe .5/10 and used MHP.     PT Treatment/Interventions  ADLs/Self Care Home Management;Patient/family education;Dry needling;Manual techniques;Therapeutic exercise;Therapeutic activities;Neuromuscular re-education;Moist Heat;Cryotherapy;Iontophoresis 4mg /ml Dexamethasone    PT Next Visit Plan  response to DN, lower trap activation, thoracic mobility,, IASTM techniques, modalities PRN, scapular stability and posture, trigger point release    PT Home Exercise Plan  rib mobs, levator scap stretch, nerve glides, book opening, upper trap stretch. foam roll rountine    Consulted and Agree with Plan of Care  Patient       Patient will benefit from skilled therapeutic intervention in order to improve the following deficits and impairments:     Visit Diagnosis: Cervicalgia  Abnormal posture  Pain in right arm     Problem List Patient Active Problem List   Diagnosis Date Noted  . Right arm weakness 07/13/2016   Starr Lake PT, DPT, LAT, ATC  07/17/17  2:36 PM      [facility] [address] [place], [place], [address] Phone: [phone]   Fax:  [phone]  Name: Lynn Holt MRN: [id number] Date of Birth: 09-24-1975

## 2017-07-24 ENCOUNTER — Ambulatory Visit: Payer: 59 | Admitting: Physical Therapy

## 2017-07-24 ENCOUNTER — Encounter: Payer: Self-pay | Admitting: Physical Therapy

## 2017-07-24 DIAGNOSIS — R293 Abnormal posture: Secondary | ICD-10-CM

## 2017-07-24 DIAGNOSIS — M79601 Pain in right arm: Secondary | ICD-10-CM | POA: Diagnosis not present

## 2017-07-24 DIAGNOSIS — M542 Cervicalgia: Secondary | ICD-10-CM

## 2017-07-24 NOTE — Therapy (Addendum)
[place], [place], [address] Phone: [phone]   Fax:  [phone]  Physical Therapy Treatment/Discharge Summary  Patient Details  Name: Lynn Holt MRN: [id number] Date of Birth: [birth year] Referring Provider: Heide Spark, MD   Encounter Date: 07/24/2017  PT End of Session - 07/24/17 1501    Visit Number  6    Number of Visits  8    Date for PT Re-Evaluation  08/07/17    Authorization Type  [facility]    PT Start Time  1501    PT Stop Time  1540    PT Time Calculation (min)  39 min    Activity Tolerance  Patient tolerated treatment well    Behavior During Therapy  [facility] for tasks assessed/performed       Past Medical History:  Diagnosis Date  . Endometriosis   . Headache     Past Surgical History:  Procedure Laterality Date  . APPENDECTOMY    . labrium repair    . OVARIAN CYST REMOVAL    . SHOULDER SURGERY    . TONSILLECTOMY      There were no vitals filed for this visit.  Subjective Assessment - 07/24/17 1501    Subjective  Feeling pretty good. still have tightness at base of skull and along Rt levator. has been doing exercises through day.     Patient Stated Goals  Relieve the pain in the R neck/shoulder region.    Currently in Pain?  Yes    Pain Score  1  <1    Pain Location  Neck    Pain Orientation  Mid;Right    Pain Descriptors / Indicators  Tightness                      [facility] Adult PT Treatment/Exercise - 07/24/17 0001      Neck Exercises: Standing   Other Standing Exercises  Y liftoff from wall yellow tband    Other Standing Exercises  serratus puss & pressup yellow tband on wall; triceps wall push up      Manual Therapy   Joint Mobilization  lateral Gr 3 lower cervical    Soft tissue mobilization  Rt levator, upper trap, subocciipital release    McConnell  upper trap inhibition taping      Neck Exercises: Stretches   Other Neck Stretches  door pec  stretch                  PT Long Term Goals - 07/10/17 1552      PT LONG TERM GOAL #1   Title  The patient will return demo HEP for cervical stretching, postural strengthening/stabilization and UE strengthening.    Time  4    Period  Weeks    Status  Achieved      PT LONG TERM GOAL #2   Title  The patient will reduce subjective pain at rest from 4/10 to 0/10 at rest.    Baseline  2/10 pain today    Time  4    Period  Weeks    Status  On-going      PT LONG TERM GOAL #3   Title  The patient will report pain at worst is 4/10 (reducing from 8/10).    Baseline  4/10 worst pain     Period  Weeks    Status  Achieved      PT LONG TERM GOAL #4   Title  The patient will improve neck flexion endurance test to 30 seconds to demo improving endurance of deep neck flexors.    Time  4    Period  Weeks    Status  Achieved      PT LONG TERM GOAL #5   Title  The patient will verbalize understanding of return to gym routine/ UE and upper back strengthening for long term.    Time  4    Period  Weeks    Status  Achieved            Plan - 07/24/17 1540    Clinical Impression Statement  progressed to standing exercises for upright challenges to lower trap, serratus and triceps. tactile and verbal cues for reduction of upper trap/levator activation. Pt verbalized comfort and understanding and denied increase in pain with session. did not DN today and pt will assess if she requires further DN.     PT Treatment/Interventions  ADLs/Self Care Home Management;Patient/family education;Dry needling;Manual techniques;Therapeutic exercise;Therapeutic activities;Neuromuscular re-education;Moist Heat;Cryotherapy;Iontophoresis 74m/ml Dexamethasone    PT Next Visit Plan  qped exercises for [facility] strengthening, modalities & manual PRN, scap stability    PT Home Exercise Plan  rib mobs, levator scap stretch, nerve glides, book opening, upper trap stretch. foam roll rountine; Y liftoff from wall,  serratus slide up wall, triceps wall push up    Consulted and Agree with Plan of Care  Patient       Patient will benefit from skilled therapeutic intervention in order to improve the following deficits and impairments:  Pain, Impaired UE functional use, Impaired flexibility, Postural dysfunction, Hypomobility  Visit Diagnosis: Cervicalgia  Abnormal posture  Pain in right arm     Problem List Patient Active Problem List   Diagnosis Date Noted  . Right arm weakness 07/13/2016    Marguarite Markov C. Latriece Anstine PT, DPT 07/24/17 3:44 PM   [facility]CP[facility]un[address] [address]Phone: [phone]  Fax:  [phone] Name: Lynn FAGERMRN: [id number] of Birth: 815-Jul-1977 PHYSICAL THERAPY DISCHARGE SUMMARY  Visits from Start of Care: 6  Current functional level related to goals / functional outcomes: See above   Remaining deficits: See above   Education / Equipment: Anatomy of condition, POC, HEP, exercise form/rationale  Plan: Patient agrees to discharge.  Patient goals were partially met. Patient is being discharged due to a change in medical status.  ?????    Admitted for surgical intervention.   Carely Nappier C. Jamail Cullers PT, DPT 09/03/17 10:58 AM

## 2017-08-14 ENCOUNTER — Other Ambulatory Visit: Payer: Self-pay | Admitting: Orthopedic Surgery

## 2017-08-14 DIAGNOSIS — G5621 Lesion of ulnar nerve, right upper limb: Secondary | ICD-10-CM | POA: Diagnosis not present

## 2017-08-21 ENCOUNTER — Other Ambulatory Visit: Payer: Self-pay

## 2017-08-21 ENCOUNTER — Encounter (HOSPITAL_BASED_OUTPATIENT_CLINIC_OR_DEPARTMENT_OTHER): Payer: Self-pay | Admitting: *Deleted

## 2017-08-26 NOTE — H&P (Signed)
Lynn Holt is an 42 y.o. female.   CC / Reason for Visit: Right elbow follow-up HPI: This patient returns reevaluation, one month following ultrasound-guided steroid injection in the right cubital tunnel.  She reports that during the lidocaine phase, all of her pain about the elbow and distally resolved such that she would be happy if her permanent change with similar.  She also reports all of her proximal symptoms have resolved to include headaches, knots and pain in the trapezius and upper arm.  She has had for major episodes that sound as if it is nerve instability, with pops and twinges of pain that feel as if she has hit her funny bone.  HPI 07-10-17: This patient returns to clinic today indicating that the steroid dose pack and meloxicam have been quite helpful and now her pain is exquisitely over the medial aspect of her elbow.  She still has altered sensibility into her ring and small fingers as well as along the ulnar aspect of her left forearm as well as a sharp pop when she rotates her forearm specifically in her medial elbow accompanied with a nerve type pain.  The patient has a history of cervical spine radiculopathy.  She has had a C-spine MRI which was negative for any specific findings.  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  She has a history of pain into her elbow which began back in September 2017 consisted of burning and numbness.  She had onset of pain specifically on 05/31/2017 when she supinated her hand to reach for coffee and felt a pop in the medial elbow.  She stated that she had immediate swelling in both the medial elbow as well as to into the digits as well as a tingling sensation.  She reports that she  has been taking 400 mg of ibuprofen every 6 hours.  She states that she feels very tender, weak and sore throughout her forearm and her medial elbow.  Past Medical History:  Diagnosis Date  . Endometriosis   . Family history of adverse reaction to anesthesia    nausea  . Headache   . PONV (postoperative nausea and vomiting)     Past Surgical History:  Procedure Laterality Date  . APPENDECTOMY    . labrium repair    . OVARIAN CYST REMOVAL    . SHOULDER SURGERY    . TONSILLECTOMY      Family History  Problem Relation Age of Onset  . Fibromyalgia Mother   . Rheum arthritis Father   . Heart attack Father   . Breast cancer Maternal Grandmother   . Colon cancer Maternal Grandmother   . Heart attack Paternal Grandfather   . Esophageal cancer Paternal Grandfather   . Neuropathy Neg Hx    Social History:  reports that she has never smoked. She has never used smokeless tobacco. She reports that she drinks alcohol. She reports that she does not use drugs.  Allergies:  Allergies  Allergen Reactions  . Morphine And Related     Other reaction(s): Fever, Skin Rashes  . Ultram  [Tramadol]     No medications prior to admission.    No results found for this or any previous visit (from the past 48 hour(s)). No results found.  Review of Systems  All other systems reviewed and are negative.   Height 5\' 2"  (1.575 m), weight 66.7 kg (147 lb), last menstrual period 08/07/2017. Physical Exam  Constitutional:  WD, WN, NAD HEENT:  NCAT, EOMI Neuro/Psych:  Alert & oriented to person, place, and time; appropriate mood & affect Lymphatic: No generalized UE edema or lymphadenopathy Extremities / MSK:  Both UE are normal with respect to appearance, ranges of motion, joint stability, muscle strength/tone, sensation, & perfusion except as otherwise noted:  The right hand and elbow are normal in appearance as is ROM of the extremity. Light touch sensibility is somewhat altered into the small  and ring fingers as well as into the dorsum of the hand and along the ulnar border of the forearm.  There is still a positive Tinel's over the ulnar nerve, and a positive elbow flexion test.  However, monofilament testing has improved to 2.83 for all the digits and there is good ulnar nerve muscle strength.  I cannot detect frank instability on exam today.  Labs / Xrays:  No new x-rays today.  2 views of the right elbow ordered and obtained 06/04/2017 demonstrate no fractures, dislocations, or bony lesions.  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  She will likely remain out of work for a week postop, planning to return with restrictions after that week.  The details of the operative procedure were discussed with the patient.  Questions were invited and answered.  In addition to the goal of the procedure, the risks of the procedure to include but not limited to bleeding; infection; damage to the nerves or blood vessels that could result in bleeding, numbness, weakness, chronic pain, and the need for additional procedures; stiffness; the need for revision surgery; and anesthetic risks were reviewed.  No specific outcome was guaranteed or implied.  Informed consent was obtained.  [name], MD 08/26/2017, 9:45 PM

## 2017-08-27 ENCOUNTER — Ambulatory Visit (HOSPITAL_BASED_OUTPATIENT_CLINIC_OR_DEPARTMENT_OTHER): Payer: 59 | Admitting: Certified Registered"

## 2017-08-27 ENCOUNTER — Other Ambulatory Visit: Payer: Self-pay

## 2017-08-27 ENCOUNTER — Encounter (HOSPITAL_BASED_OUTPATIENT_CLINIC_OR_DEPARTMENT_OTHER): Payer: Self-pay | Admitting: Certified Registered"

## 2017-08-27 ENCOUNTER — Ambulatory Visit (HOSPITAL_BASED_OUTPATIENT_CLINIC_OR_DEPARTMENT_OTHER)
Admission: RE | Admit: 2017-08-27 | Discharge: 2017-08-27 | Disposition: A | Discharge status: Home / Self Care | Payer: 59 | Source: Ambulatory Visit | Attending: Orthopedic Surgery | Admitting: Orthopedic Surgery

## 2017-08-27 ENCOUNTER — Encounter (HOSPITAL_BASED_OUTPATIENT_CLINIC_OR_DEPARTMENT_OTHER)
Admission: RE | Disposition: A | Discharge status: Home / Self Care | Payer: Self-pay | Source: Ambulatory Visit | Attending: Orthopedic Surgery

## 2017-08-27 DIAGNOSIS — G5621 Lesion of ulnar nerve, right upper limb: Secondary | ICD-10-CM | POA: Insufficient documentation

## 2017-08-27 HISTORY — PX: ULNAR NERVE TRANSPOSITION: SHX2595

## 2017-08-27 HISTORY — DX: Nausea with vomiting, unspecified: R11.2

## 2017-08-27 HISTORY — DX: Other specified postprocedural states: Z98.890

## 2017-08-27 HISTORY — DX: Family history of other specified conditions: Z84.89

## 2017-08-27 LAB — POCT PREGNANCY, URINE: Preg Test, Ur: NEGATIVE

## 2017-08-27 SURGERY — ULNAR NERVE DECOMPRESSION/TRANSPOSITION
Anesthesia: General | Site: Elbow | Laterality: Right

## 2017-08-27 MED ORDER — MIDAZOLAM HCL 2 MG/2ML IJ SOLN
INTRAMUSCULAR | Status: AC
  Filled 2017-08-27: qty 2

## 2017-08-27 MED ORDER — DEXAMETHASONE SODIUM PHOSPHATE 10 MG/ML IJ SOLN
INTRAMUSCULAR | Status: DC | PRN
  Administered 2017-08-27: 10 mg via INTRAVENOUS

## 2017-08-27 MED ORDER — OXYCODONE HCL 5 MG PO TABS: 5.0000 mg | ORAL_TABLET | Freq: Four times a day (QID) | ORAL | 0 refills | Status: DC | PRN

## 2017-08-27 MED ORDER — FENTANYL CITRATE (PF) 100 MCG/2ML IJ SOLN: 25.0000 ug | INTRAMUSCULAR | Status: DC | PRN

## 2017-08-27 MED ORDER — CEFAZOLIN SODIUM-DEXTROSE 2-4 GM/100ML-% IV SOLN
2.0000 g | INTRAVENOUS | Status: AC
  Administered 2017-08-27: 2 g via INTRAVENOUS

## 2017-08-27 MED ORDER — PROPOFOL 10 MG/ML IV BOLUS
INTRAVENOUS | Status: DC | PRN
  Administered 2017-08-27: 200 mg via INTRAVENOUS

## 2017-08-27 MED ORDER — EPHEDRINE 5 MG/ML INJ
INTRAVENOUS | Status: AC
  Filled 2017-08-27: qty 30

## 2017-08-27 MED ORDER — FENTANYL CITRATE (PF) 100 MCG/2ML IJ SOLN
INTRAMUSCULAR | Status: AC
Start: 2017-08-27 — End: ?
  Filled 2017-08-27: qty 2

## 2017-08-27 MED ORDER — MELOXICAM 15 MG PO TABS: 15.0000 mg | ORAL_TABLET | Freq: Every day | ORAL | 2 refills | Status: AC

## 2017-08-27 MED ORDER — BUPIVACAINE-EPINEPHRINE 0.5% -1:200000 IJ SOLN
INTRAMUSCULAR | Status: DC | PRN
  Administered 2017-08-27: 10 mL

## 2017-08-27 MED ORDER — LACTATED RINGERS IV SOLN
INTRAVENOUS | Status: DC
  Administered 2017-08-27 (×2): via INTRAVENOUS

## 2017-08-27 MED ORDER — LIDOCAINE 2% (20 MG/ML) 5 ML SYRINGE
INTRAMUSCULAR | Status: DC | PRN
  Administered 2017-08-27: 80 mg via INTRAVENOUS

## 2017-08-27 MED ORDER — ACETAMINOPHEN 325 MG PO TABS: 650.0000 mg | ORAL_TABLET | Freq: Four times a day (QID) | ORAL | Status: DC

## 2017-08-27 MED ORDER — FENTANYL CITRATE (PF) 100 MCG/2ML IJ SOLN
50.0000 ug | INTRAMUSCULAR | Status: DC | PRN
  Administered 2017-08-27 (×2): 50 ug via INTRAVENOUS

## 2017-08-27 MED ORDER — LACTATED RINGERS IV SOLN: INTRAVENOUS | Status: DC

## 2017-08-27 MED ORDER — CEFAZOLIN SODIUM-DEXTROSE 2-4 GM/100ML-% IV SOLN
INTRAVENOUS | Status: AC
Start: 2017-08-27 — End: ?
  Filled 2017-08-27: qty 100

## 2017-08-27 MED ORDER — SCOPOLAMINE 1 MG/3DAYS TD PT72: 1.0000 | MEDICATED_PATCH | Freq: Once | TRANSDERMAL | Status: DC | PRN

## 2017-08-27 MED ORDER — MEPERIDINE HCL 25 MG/ML IJ SOLN: 6.2500 mg | INTRAMUSCULAR | Status: DC | PRN

## 2017-08-27 MED ORDER — ONDANSETRON HCL 4 MG/2ML IJ SOLN
INTRAMUSCULAR | Status: DC | PRN
  Administered 2017-08-27: 4 mg via INTRAVENOUS

## 2017-08-27 MED ORDER — METOCLOPRAMIDE HCL 5 MG/ML IJ SOLN: 10.0000 mg | Freq: Once | INTRAMUSCULAR | Status: DC | PRN

## 2017-08-27 MED ORDER — MIDAZOLAM HCL 2 MG/2ML IJ SOLN
1.0000 mg | INTRAMUSCULAR | Status: DC | PRN
  Administered 2017-08-27: 2 mg via INTRAVENOUS

## 2017-08-27 SURGICAL SUPPLY — 58 items
BENZOIN TINCTURE PRP APPL 2/3 (GAUZE/BANDAGES/DRESSINGS) ×2 IMPLANT
BLADE MINI RND TIP GREEN BEAV (BLADE) IMPLANT
BLADE SURG 15 STRL LF DISP TIS (BLADE) ×1 IMPLANT
BLADE SURG 15 STRL SS (BLADE) ×1
BNDG COHESIVE 4X5 TAN STRL (GAUZE/BANDAGES/DRESSINGS) ×2 IMPLANT
BNDG ESMARK 4X9 LF (GAUZE/BANDAGES/DRESSINGS) ×2 IMPLANT
BNDG GAUZE ELAST 4 BULKY (GAUZE/BANDAGES/DRESSINGS) ×4 IMPLANT
CHLORAPREP W/TINT 26ML (MISCELLANEOUS) ×2 IMPLANT
CORD BIPOLAR FORCEPS 12FT (ELECTRODE) ×2 IMPLANT
COVER BACK TABLE 60X90IN (DRAPES) ×2 IMPLANT
COVER MAYO STAND STRL (DRAPES) ×2 IMPLANT
CUFF TOURN SGL LL 18 NRW (TOURNIQUET CUFF) ×2 IMPLANT
DRAIN PENROSE 1/2X12 LTX STRL (WOUND CARE) IMPLANT
DRAIN PENROSE 1/4X12 LTX STRL (WOUND CARE) IMPLANT
DRAPE EXTREMITY T 121X128X90 (DRAPE) ×2 IMPLANT
DRAPE SURG 17X23 STRL (DRAPES) IMPLANT
DRAPE U-SHAPE 47X51 STRL (DRAPES) ×2 IMPLANT
DRSG ADAPTIC 3X8 NADH LF (GAUZE/BANDAGES/DRESSINGS) IMPLANT
DRSG EMULSION OIL 3X3 NADH (GAUZE/BANDAGES/DRESSINGS) ×2 IMPLANT
ELECT REM PT RETURN 9FT ADLT (ELECTROSURGICAL) ×2
ELECTRODE REM PT RTRN 9FT ADLT (ELECTROSURGICAL) ×1 IMPLANT
GAUZE SPONGE 4X4 12PLY STRL LF (GAUZE/BANDAGES/DRESSINGS) ×2 IMPLANT
GLOVE BIO SURGEON STRL SZ 6.5 (GLOVE) ×2 IMPLANT
GLOVE BIO SURGEON STRL SZ7.5 (GLOVE) ×2 IMPLANT
GLOVE BIOGEL PI IND STRL 7.0 (GLOVE) ×2 IMPLANT
GLOVE BIOGEL PI IND STRL 8 (GLOVE) ×1 IMPLANT
GLOVE BIOGEL PI INDICATOR 7.0 (GLOVE) ×2
GLOVE BIOGEL PI INDICATOR 8 (GLOVE) ×1
GLOVE ECLIPSE 6.5 STRL STRAW (GLOVE) ×2 IMPLANT
GOWN STRL REUS W/ TWL LRG LVL3 (GOWN DISPOSABLE) ×2 IMPLANT
GOWN STRL REUS W/TWL LRG LVL3 (GOWN DISPOSABLE) ×2
GOWN STRL REUS W/TWL XL LVL3 (GOWN DISPOSABLE) ×2 IMPLANT
LOOP VESSEL MAXI BLUE (MISCELLANEOUS) IMPLANT
NEEDLE HYPO 25X1 1.5 SAFETY (NEEDLE) ×2 IMPLANT
NS IRRIG 1000ML POUR BTL (IV SOLUTION) ×2 IMPLANT
PACK BASIN DAY SURGERY FS (CUSTOM PROCEDURE TRAY) ×2 IMPLANT
PADDING CAST ABS 4INX4YD NS (CAST SUPPLIES) ×1
PADDING CAST ABS COTTON 4X4 ST (CAST SUPPLIES) ×1 IMPLANT
PENCIL BUTTON HOLSTER BLD 10FT (ELECTRODE) ×2 IMPLANT
SLEEVE SCD COMPRESS KNEE MED (MISCELLANEOUS) ×2 IMPLANT
SLING ARM FOAM STRAP LRG (SOFTGOODS) IMPLANT
SLING ARM MED ADULT FOAM STRAP (SOFTGOODS) IMPLANT
SLING ARM XL FOAM STRAP (SOFTGOODS) IMPLANT
STOCKINETTE 6  STRL (DRAPES) ×1
STOCKINETTE 6 STRL (DRAPES) ×1 IMPLANT
STRIP CLOSURE SKIN 1/2X4 (GAUZE/BANDAGES/DRESSINGS) ×2 IMPLANT
SUT ETHILON 8 0 BV130 4 (SUTURE) IMPLANT
SUT VIC AB 0 SH 27 (SUTURE) IMPLANT
SUT VIC AB 2-0 SH 27 (SUTURE)
SUT VIC AB 2-0 SH 27XBRD (SUTURE) IMPLANT
SUT VIC AB 3-0 SH 27 (SUTURE) ×1
SUT VIC AB 3-0 SH 27X BRD (SUTURE) ×1 IMPLANT
SUT VICRYL RAPIDE 4/0 PS 2 (SUTURE) ×2 IMPLANT
SYR 10ML LL (SYRINGE) ×2 IMPLANT
SYR BULB 3OZ (MISCELLANEOUS) ×2 IMPLANT
TOWEL OR 17X24 6PK STRL BLUE (TOWEL DISPOSABLE) ×4 IMPLANT
TOWEL OR NON WOVEN STRL DISP B (DISPOSABLE) ×2 IMPLANT
UNDERPAD 30X30 (UNDERPADS AND DIAPERS) IMPLANT

## 2017-08-27 NOTE — Transfer of Care (Signed)
Immediate Anesthesia Transfer of Care Note  Patient: Lynn Holt  Procedure(s) Performed: RIGHT ULNAR NERVE TRANSPOSITION AT THE ELBOW (Right Elbow)  Patient Location: PACU  Anesthesia Type:General  Level of Consciousness: awake and patient cooperative  Airway & Oxygen Therapy: Patient Spontanous Breathing and Patient connected to face mask oxygen  Post-op Assessment: Report given to RN and Post -op Vital signs reviewed and stable  Post vital signs: Reviewed and stable  Last Vitals:  Vitals Value Taken Time  BP 114/81 08/27/2017 12:01 PM  Temp    Pulse 93 08/27/2017 12:03 PM  Resp 15 08/27/2017 12:03 PM  SpO2 100 % 08/27/2017 12:03 PM  Vitals shown include unvalidated device data.  Last Pain:  Vitals:   08/27/17 0907  TempSrc: Oral         Complications: No apparent anesthesia complications

## 2017-08-27 NOTE — Anesthesia Postprocedure Evaluation (Signed)
Anesthesia Post Note  Patient: Lynn Holt  Procedure(s) Performed: RIGHT ULNAR NERVE TRANSPOSITION AT THE ELBOW (Right Elbow)     Patient location during evaluation: PACU Anesthesia Type: General Level of consciousness: awake and alert Pain management: pain level controlled Vital Signs Assessment: post-procedure vital signs reviewed and stable Respiratory status: spontaneous breathing, nonlabored ventilation, respiratory function stable and patient connected to nasal cannula oxygen Cardiovascular status: blood pressure returned to baseline and stable Postop Assessment: no apparent nausea or vomiting Anesthetic complications: no    Last Vitals:  Vitals:   08/27/17 1215 08/27/17 1244  BP: 129/87 (!) 135/91  Pulse: (!) 104 91  Resp: 14 16  Temp:  36.6 C  SpO2: 100% 100%    Last Pain:  Vitals:   08/27/17 1244  TempSrc: Oral  PainSc: 0-No pain                 Montez Hageman

## 2017-08-27 NOTE — Anesthesia Procedure Notes (Signed)
Procedure Name: LMA Insertion Date/Time: 08/27/2017 11:15 AM Performed by: Signe Colt, CRNA Pre-anesthesia Checklist: Patient identified, Emergency Drugs available, Suction available and Patient being monitored Patient Re-evaluated:Patient Re-evaluated prior to induction Oxygen Delivery Method: Circle system utilized Preoxygenation: Pre-oxygenation with 100% oxygen Induction Type: IV induction Ventilation: Mask ventilation without difficulty LMA: LMA inserted LMA Size: 4.0 Number of attempts: 1 Airway Equipment and Method: Bite block Placement Confirmation: positive ETCO2 Tube secured with: Tape Dental Injury: Teeth and Oropharynx as per pre-operative assessment

## 2017-08-27 NOTE — Anesthesia Preprocedure Evaluation (Signed)
Anesthesia Evaluation  Patient identified by MRN, date of birth, ID band Patient awake    Reviewed: Allergy & Precautions, NPO status , Patient's Chart, lab work & pertinent test results  History of Anesthesia Complications (+) PONV  Airway Mallampati: II  TM Distance: >3 FB Neck ROM: Full    Dental no notable dental hx.    Pulmonary neg pulmonary ROS,    Pulmonary exam normal breath sounds clear to auscultation       Cardiovascular negative cardio ROS Normal cardiovascular exam Rhythm:Regular Rate:Normal     Neuro/Psych negative neurological ROS  negative psych ROS   GI/Hepatic negative GI ROS, Neg liver ROS,   Endo/Other  negative endocrine ROS  Renal/GU negative Renal ROS  negative genitourinary   Musculoskeletal negative musculoskeletal ROS (+)   Abdominal   Peds negative pediatric ROS (+)  Hematology negative hematology ROS (+)   Anesthesia Other Findings   Reproductive/Obstetrics negative OB ROS                             Anesthesia Physical Anesthesia Plan  ASA: II  Anesthesia Plan: General   Post-op Pain Management:    Induction: Intravenous  PONV Risk Score and Plan: 4 or greater and Ondansetron, Dexamethasone and Midazolam  Airway Management Planned: LMA  Additional Equipment:   Intra-op Plan:   Post-operative Plan: Extubation in OR  Informed Consent: I have reviewed the patients History and Physical, chart, labs and discussed the procedure including the risks, benefits and alternatives for the proposed anesthesia with the patient or authorized representative who has indicated his/her understanding and acceptance.   Dental advisory given  Plan Discussed with: CRNA  Anesthesia Plan Comments: (No scopolamine patch)        Anesthesia Quick Evaluation

## 2017-08-27 NOTE — Discharge Instructions (Signed)
°  Post Anesthesia Home Care Instructions  Activity: Get plenty of rest for the remainder of the day. A responsible individual must stay with you for 24 hours following the procedure.  For the next 24 hours, DO NOT: -Drive a car -Paediatric nurse -Drink alcoholic beverages -Take any medication unless instructed by your physician -Make any legal decisions or sign important papers.  Meals: Start with liquid foods such as gelatin or soup. Progress to regular foods as tolerated. Avoid greasy, spicy, heavy foods. If nausea and/or vomiting occur, drink only clear liquids until the nausea and/or vomiting subsides. Call your physician if vomiting continues.  Special Instructions/Symptoms: Your throat may feel dry or sore from the anesthesia or the breathing tube placed in your throat during surgery. If this causes discomfort, gargle with warm salt water. The discomfort should disappear within 24 hours.  If you had a scopolamine patch placed behind your ear for the management of post- operative nausea and/or vomiting:  1. The medication in the patch is effective for 72 hours, after which it should be removed.  Wrap patch in a tissue and discard in the trash. Wash hands thoroughly with soap and water. 2. You may remove the patch earlier than 72 hours if you experience unpleasant side effects which may include dry mouth, dizziness or visual disturbances. 3. Avoid touching the patch. Wash your hands with soap and water after contact with the patch.     Discharge Instructions   You have a light dressing on your hand.  You may begin gentle motion of your fingers and hand immediately, but you should not do any heavy lifting or gripping.  Elevate your hand to reduce pain & swelling of the digits.  Ice over the operative site may be helpful to reduce pain & swelling.  DO NOT USE HEAT. Pain medicine has been prescribed for you.  Take Meloxicam as prescribed. Take Tylenol 650 mg every 6 hours. Take  Oxycodone 5 mg as a rescue medicine for severe pain. Leave the dressing in place until the third day after your surgery and then remove it, leaving it open to air.  After the bandage has been removed you may shower, regularly washing the incision and letting the water run over it, but not submerging it (no swimming, soaking it in dishwater, etc.) You may drive a car when you are off of prescription pain medications and can safely control your vehicle with both hands. We will address whether therapy will be required or not when you return to the office. You may have already made your follow-up appointment when we completed your preop visit.  If not, please call our office today or the next business day to make your return appointment for 10-15 days after surgery.   Please call [phone] during normal business hours or [phone] after hours for any problems. Including the following:  - excessive redness of the incisions - drainage for more than 4 days - fever of more than 101.5 F  *Please note that pain medications will not be refilled after hours or on weekends.  WORK STATUS: OUT OF WORK until 1st post operative appointment.

## 2017-08-27 NOTE — Op Note (Signed)
08/27/2017  10:51 AM  PATIENT:  Lynn Holt  42 y.o. 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 Grandville Silos, MD  PHYSICIAN ASSISTANT: Morley Kos, OPA-C  ANESTHESIA: General  SPECIMENS:  None  DRAINS:   None  EBL:  less than 50 mL  PREOPERATIVE INDICATIONS:  Lynn Holt is a  42 y.o. female with a history of right ulnar neuropathy at the elbow, with episodes of likely nerve instability.  The risks benefits and alternatives were discussed with the patient preoperatively including but not limited to the risks of infection, bleeding, nerve injury, cardiopulmonary complications, the need for revision surgery, among others, and the patient verbalized understanding and consented to proceed.  OPERATIVE IMPLANTS: none  OPERATIVE PROCEDURE:  After receiving prophylactic antibiotics, the patient was escorted to the operative theatre and placed in a supine position.  A surgical "time-out" was performed during which the planned procedure, proposed operative site, and the correct patient identity were compared to the operative consent and agreement confirmed by the circulating nurse according to current facility policy.  The limb was then prepped with ChloraPrep and draped in usual sterile fashion.  Sterile tourniquet was applied.  The limb was exsanguinated with an Esmarch bandage and tourniquet inflated to 250 mmHg.  Half percent Marcaine with epinephrine was instilled about the surgical field to help with postoperative pain control and hemostasis.  A curvilinear incision was made over the course of the ulnar nerve posterior to the medial epicondyle.  Skin flaps were developed, with care to protect and preserve the medial antebrachial cutaneous nerve and its branches.  The nerve was found to be unstable, with thumb  pressure it could be pressed to ride up upon the prominence of the medial epicondyle.  Decision was made to decompress and transpose it.  It was fully decompressed from 10 cm proximal to the cubital tunnel all the way to the distal portion of the FCU.  The FCU fascia was split both superficially and deep.  Ulnar nerve branches to the FCU are preserved.  The nerve was then transposed to the anterior subcutaneous pocket created for it in the medial brachial septum was excised.  A distally based fascial flap was developed for a sling from the flexor pronator mass and secured with 3-0 Vicryl suture to the superficial fascia above it.  There was space for the nerve, it could move and glide proximally and distally easily without being kinked.  Some of the FCU or the nerve crossed it was divided to again prevent any secondary impingement.  Tourniquet was released, the wound irrigated, and the skin closed with 3-0 Vicryl deep dermal buried subcuticular sutures and a running 4-0 Vicryl Rapide horizontal subcuticular suture with benzoin and Steri-Strips.  A bulky dressing was applied and she was taken to the recovery room in stable condition, breathing spontaneously.   DISPOSITION: She will be discharged home today with typical instructions, returning in 10-15 days.

## 2017-08-27 NOTE — Interval H&P Note (Signed)
History and Physical Interval Note:  08/27/2017 9:30 AM  Lynn Holt  has presented today for surgery, with the diagnosis of RIGHT ULNAR NEUROPATHY AT THE ELBOW G56.21  The various methods of treatment have been discussed with the patient and family. After consideration of risks, benefits and other options for treatment, the patient has consented to  Procedure(s): [name] (Right) as a surgical intervention .  The patient's history has been reviewed, patient examined, no change in status, stable for surgery.  I have reviewed the patient's chart and labs.  Questions were answered to the patient's satisfaction.     [name]

## 2017-08-28 ENCOUNTER — Encounter (HOSPITAL_BASED_OUTPATIENT_CLINIC_OR_DEPARTMENT_OTHER): Payer: Self-pay | Admitting: Orthopedic Surgery

## 2017-09-06 DIAGNOSIS — G5621 Lesion of ulnar nerve, right upper limb: Secondary | ICD-10-CM | POA: Diagnosis not present

## 2017-10-09 DIAGNOSIS — G5621 Lesion of ulnar nerve, right upper limb: Secondary | ICD-10-CM | POA: Diagnosis not present

## 2017-10-29 ENCOUNTER — Encounter: Payer: Self-pay | Admitting: Obstetrics & Gynecology

## 2017-10-30 ENCOUNTER — Telehealth: Payer: Self-pay | Admitting: Obstetrics & Gynecology

## 2017-10-30 ENCOUNTER — Other Ambulatory Visit: Payer: Self-pay | Admitting: Obstetrics & Gynecology

## 2017-10-30 ENCOUNTER — Other Ambulatory Visit: Payer: Self-pay

## 2017-10-30 DIAGNOSIS — N920 Excessive and frequent menstruation with regular cycle: Secondary | ICD-10-CM

## 2017-10-30 DIAGNOSIS — Z1231 Encounter for screening mammogram for malignant neoplasm of breast: Secondary | ICD-10-CM

## 2017-10-30 MED ORDER — MEDROXYPROGESTERONE ACETATE 150 MG/ML IM SUSP: 150.0000 mg | INTRAMUSCULAR | 3 refills | Status: DC

## 2017-10-30 NOTE — Telephone Encounter (Signed)
Patient is schedule for Annual  11/08/17 with [facility] requesting depo refill.

## 2017-10-30 NOTE — Telephone Encounter (Signed)
Refill sent left message to make pt aware

## 2017-11-08 ENCOUNTER — Ambulatory Visit (INDEPENDENT_AMBULATORY_CARE_PROVIDER_SITE_OTHER): Payer: 59 | Admitting: Obstetrics & Gynecology

## 2017-11-08 ENCOUNTER — Encounter: Payer: Self-pay | Admitting: Obstetrics & Gynecology

## 2017-11-08 VITALS — BP 120/80 | Ht 62.0 in | Wt 160.0 lb

## 2017-11-08 DIAGNOSIS — Z1231 Encounter for screening mammogram for malignant neoplasm of breast: Secondary | ICD-10-CM | POA: Diagnosis not present

## 2017-11-08 DIAGNOSIS — Z0000 Encounter for general adult medical examination without abnormal findings: Secondary | ICD-10-CM | POA: Diagnosis not present

## 2017-11-08 DIAGNOSIS — Z1239 Encounter for other screening for malignant neoplasm of breast: Secondary | ICD-10-CM

## 2017-11-08 NOTE — Progress Notes (Signed)
HPI:      [name] is a 41 y.o. G0P0000 who LMP was No LMP recorded. Patient has had an injection., she presents today for her annual examination. The patient has no complaints today. The patient is sexually active. Her last pap: approximate date 2018 and was normal and last mammogram: not done yet. The patient does perform self breast exams.  There is no notable family history of breast or ovarian cancer in her family.  The patient has regular exercise: yes.  The patient denies current symptoms of depression.    GYN History: Contraception: Depo-Provera injections  PMHx: Past Medical History:  Diagnosis Date  . Endometriosis   . Family history of adverse reaction to anesthesia    nausea  . Headache   . PONV (postoperative nausea and vomiting)    Past Surgical History:  Procedure Laterality Date  . APPENDECTOMY    . labrium repair    . OVARIAN CYST REMOVAL    . SHOULDER SURGERY    . TONSILLECTOMY    . ULNAR NERVE TRANSPOSITION Right 08/27/2017   Procedure: RIGHT ULNAR NERVE TRANSPOSITION AT THE ELBOW;  Surgeon: Milly Jakob, MD;  Location: [facility];  Service: Orthopedics;  Laterality: Right;   Family History  Problem Relation Age of Onset  . Fibromyalgia Mother   . Rheum arthritis Father   . Heart attack Father   . Breast cancer Maternal Grandmother   . Colon cancer Maternal Grandmother   . Heart attack Paternal Grandfather   . Esophageal cancer Paternal Grandfather   . Neuropathy Neg Hx    Social History   Tobacco Use  . Smoking status: Never Smoker  . Smokeless tobacco: Never Used  Substance Use Topics  . Alcohol use: Yes    Comment: occas  . Drug use: No    Current Outpatient Medications:  .  Cholecalciferol (VITAMIN D) 2000 units CAPS, Take by mouth., Disp: , Rfl:  .  DULoxetine (CYMBALTA) 30 MG capsule, Take 1 capsule (30 mg total) by mouth daily., Disp: 30 capsule, Rfl: 6 .  medroxyPROGESTERone (DEPO-PROVERA) 150 MG/ML injection,  Inject 1 mL (150 mg total) into the muscle every 3 (three) months., Disp: 1 mL, Rfl: 3 .  Multiple Vitamin (MULTIVITAMIN) capsule, Take 1 capsule by mouth daily., Disp: , Rfl:  .  acetaminophen (TYLENOL) 325 MG tablet, Take 2 tablets (650 mg total) by mouth every 6 (six) hours. (Patient not taking: Reported on 11/08/2017), Disp: , Rfl:  .  meloxicam (MOBIC) 15 MG tablet, Take 1 tablet (15 mg total) by mouth daily., Disp: 30 tablet, Rfl: 2 .  oxyCODONE (ROXICODONE) 5 MG immediate release tablet, Take 1 tablet (5 mg total) by mouth every 6 (six) hours as needed for breakthrough pain. (Patient not taking: Reported on 11/08/2017), Disp: 20 tablet, Rfl: 0 Allergies: Morphine and related and Ultram  [tramadol]  Review of Systems  Constitutional: Negative for chills, fever and malaise/fatigue.  HENT: Negative for congestion, sinus pain and sore throat.   Eyes: Negative for blurred vision and pain.  Respiratory: Negative for cough and wheezing.   Cardiovascular: Negative for chest pain and leg swelling.  Gastrointestinal: Negative for abdominal pain, constipation, diarrhea, heartburn, nausea and vomiting.  Genitourinary: Negative for dysuria, frequency, hematuria and urgency.  Musculoskeletal: Negative for back pain, joint pain, myalgias and neck pain.  Skin: Negative for itching and rash.  Neurological: Negative for dizziness, tremors and weakness.  Endo/Heme/Allergies: Does not bruise/bleed easily.  Psychiatric/Behavioral: Negative for  depression. The patient is not nervous/anxious and does not have insomnia.    Objective: BP 120/80   Ht 5\' 2"  (1.575 m)   Wt 160 lb (72.6 kg)   BMI 29.26 kg/m   Filed Weights   11/08/17 0954  Weight: 160 lb (72.6 kg)   Body mass index is 29.26 kg/m. Physical Exam  Constitutional: She is oriented to person, place, and time. She appears well-developed and well-nourished. No distress.  Genitourinary: Rectum normal, vagina normal and uterus normal. Pelvic exam  was performed with patient supine. There is no rash or lesion on the right labia. There is no rash or lesion on the left labia. Vagina exhibits no lesion. No bleeding in the vagina. Right adnexum does not display mass and does not display tenderness. Left adnexum does not display mass and does not display tenderness. Cervix does not exhibit motion tenderness, lesion, friability or polyp.   Uterus is mobile and midaxial. Uterus is not enlarged or exhibiting a mass.  HENT:  Head: Normocephalic and atraumatic. Head is without laceration.  Right Ear: Hearing normal.  Left Ear: Hearing normal.  Nose: No epistaxis.  No foreign bodies.  Mouth/Throat: Uvula is midline, oropharynx is clear and moist and mucous membranes are normal.  Eyes: Pupils are equal, round, and reactive to light.  Neck: Normal range of motion. Neck supple. No thyromegaly present.  Cardiovascular: Normal rate and regular rhythm. Exam reveals no gallop and no friction rub.  No murmur heard. Pulmonary/Chest: Effort normal and breath sounds normal. No respiratory distress. She has no wheezes. Right breast exhibits no mass, no skin change and no tenderness. Left breast exhibits no mass, no skin change and no tenderness.  Abdominal: Soft. Bowel sounds are normal. She exhibits no distension. There is no tenderness. There is no rebound.  Musculoskeletal: Normal range of motion.  Neurological: She is alert and oriented to person, place, and time. No cranial nerve deficit.  Skin: Skin is warm and dry.  Psychiatric: She has a normal mood and affect. Judgment normal.  Vitals reviewed.  Assessment:  ANNUAL EXAM 1. Annual physical exam   2. Screening for breast cancer    Screening Plan:            1.  Cervical Screening-  Pap smear schedule reviewed with patient  2. Breast screening- Exam annually and mammogram>40 planned   3. Colonoscopy every 10 years, Hemoccult testing - after age 52  4. Labs UTD  5. Counseling for  contraception: Depo-Provera    F/U  Return in about 1 year (around 11/09/2018) for Annual.  Barnett Applebaum, MD, [facility], [facility] 11/08/2017  10:32 AM

## 2017-11-08 NOTE — Patient Instructions (Signed)
PAP every three years Mammogram every year    Call [phone] to schedule at [facility] next year

## 2017-11-12 ENCOUNTER — Ambulatory Visit
Admission: RE | Admit: 2017-11-12 | Discharge: 2017-11-12 | Disposition: A | Discharge status: Home / Self Care | Payer: 59 | Source: Ambulatory Visit | Attending: Obstetrics & Gynecology | Admitting: Obstetrics & Gynecology

## 2017-11-12 DIAGNOSIS — Z1231 Encounter for screening mammogram for malignant neoplasm of breast: Secondary | ICD-10-CM | POA: Insufficient documentation

## 2017-11-26 ENCOUNTER — Ambulatory Visit: Payer: Self-pay | Admitting: Family Medicine

## 2017-11-26 VITALS — BP 120/90 | HR 85 | Temp 98.5°F | Wt 162.0 lb

## 2017-11-26 DIAGNOSIS — J0100 Acute maxillary sinusitis, unspecified: Secondary | ICD-10-CM

## 2017-11-26 MED ORDER — FLUCONAZOLE 150 MG PO TABS: 150.0000 mg | ORAL_TABLET | Freq: Once | ORAL | 0 refills | Status: AC

## 2017-11-26 MED ORDER — AMOXICILLIN-POT CLAVULANATE 875-125 MG PO TABS: 1.0000 | ORAL_TABLET | Freq: Two times a day (BID) | ORAL | 0 refills | Status: DC

## 2017-11-26 NOTE — Patient Instructions (Signed)

## 2017-11-26 NOTE — Progress Notes (Signed)
Subjective:  Lynn Holt is a 42 y.o. female who presents for evaluation of possible sinusitis.  Symptoms include congestion and headache described as increased right sided facial pressure, right ear pain, and right sided upper neck stiffness.  Onset of symptoms was 4 days ago, and has been gradually worsening since that time.  Treatment to date:  takes chronic ceterizine. Excedrine for headache without relief.  High risk factors for influenza complications:  none. The following portions of the patient's history were reviewed and updated as appropriate:  allergies, current medications and past medical history.  Pertinent items are noted in HPI.   Objective:  Physical Exam  Constitutional: She is oriented to person, place, and time and well-developed, well-nourished, and in no distress. Vital signs are normal.  HENT:  Right Ear: No swelling or tenderness. A middle ear effusion is present.  Nose: Mucosal edema and rhinorrhea present. Right sinus exhibits maxillary sinus tenderness.  Cardiovascular: Normal rate, regular rhythm, normal heart sounds and intact distal pulses.  Pulmonary/Chest: Effort normal and breath sounds normal.  Neurological: She is oriented to person, place, and time.   Assessment and Plan:  1. Acute maxillary sinusitis, recurrence not specified Discussed diagnosis and treatment of sinusitis.  Meds ordered this encounter  Medications  . amoxicillin-clavulanate (AUGMENTIN) 875-125 MG tablet    Sig: Take 1 tablet by mouth 2 (two) times daily.    Dispense:  20 tablet    Refill:  0  . fluconazole (DIFLUCAN) 150 MG tablet    Sig: Take 1 tablet (150 mg total) by mouth once for 1 dose.    Dispense:  1 tablet    Refill:  0   Carroll Sage. Kenton Kingfisher, MSN, FNP-C [facility]  [place], [place] [address] [phone]

## 2017-11-30 ENCOUNTER — Telehealth: Payer: Self-pay | Admitting: Emergency Medicine

## 2017-11-30 NOTE — Telephone Encounter (Signed)
Left message follow up call from visit with [facility]. 

## 2017-12-20 ENCOUNTER — Ambulatory Visit: Payer: Self-pay | Admitting: Family Medicine

## 2017-12-20 ENCOUNTER — Encounter

## 2017-12-26 DIAGNOSIS — G5621 Lesion of ulnar nerve, right upper limb: Secondary | ICD-10-CM | POA: Diagnosis not present

## 2017-12-28 ENCOUNTER — Other Ambulatory Visit: Payer: Self-pay | Admitting: Neurology

## 2018-01-01 ENCOUNTER — Ambulatory Visit: Payer: Self-pay | Admitting: Family Medicine

## 2018-01-01 ENCOUNTER — Encounter: Payer: Self-pay | Admitting: Family Medicine

## 2018-01-01 VITALS — BP 130/100 | HR 84 | Temp 98.4°F | Wt 162.0 lb

## 2018-01-01 DIAGNOSIS — J019 Acute sinusitis, unspecified: Secondary | ICD-10-CM

## 2018-01-01 MED ORDER — CEFDINIR 300 MG PO CAPS: 600.0000 mg | ORAL_CAPSULE | Freq: Every day | ORAL | 0 refills | Status: DC

## 2018-01-01 MED ORDER — IPRATROPIUM BROMIDE 0.03 % NA SOLN: 2.0000 | Freq: Two times a day (BID) | NASAL | 0 refills | Status: DC

## 2018-01-01 MED ORDER — PREDNISONE 20 MG PO TABS: 40.0000 mg | ORAL_TABLET | Freq: Every day | ORAL | 0 refills | Status: AC

## 2018-01-01 NOTE — Patient Instructions (Addendum)
Start Omnicef  600 mg  daily with food to avoid stomach upset. Complete all medication.  Start Atrovent nasal sprays 2 sprays per nare twice daily x10 days you may discontinue after you complete the antibiotic and use as needed after.   Consistently take Zyrtec 10 mg at bedtime daily for at least the next 4 weeks to prevent rebound of sinus congestion.     Sinusitis, Adult Sinusitis is soreness and inflammation of your sinuses. Sinuses are hollow spaces in the bones around your face. They are located:  Around your eyes.  In the middle of your forehead.  Behind your nose.  In your cheekbones.  Your sinuses and nasal passages are lined with a stringy fluid (mucus). Mucus normally drains out of your sinuses. When your nasal tissues get inflamed or swollen, the mucus can get trapped or blocked so air cannot flow through your sinuses. This lets bacteria, viruses, and funguses grow, and that leads to infection. Follow these instructions at home: Medicines  Take, use, or apply over-the-counter and prescription medicines only as told by your doctor. These may include nasal sprays.  If you were prescribed an antibiotic medicine, take it as told by your doctor. Do not stop taking the antibiotic even if you start to feel better. Hydrate and Humidify  Drink enough water to keep your pee (urine) clear or pale yellow.  Use a cool mist humidifier to keep the humidity level in your home above 50%.  Breathe in steam for 10-15 minutes, 3-4 times a day or as told by your doctor. You can do this in the bathroom while a hot shower is running.  Try not to spend time in cool or dry air. Rest  Rest as much as possible.  Sleep with your head raised (elevated).  Make sure to get enough sleep each night. General instructions  Put a warm, moist washcloth on your face 3-4 times a day or as told by your doctor. This will help with discomfort.  Wash your hands often with soap and water. If there is  no soap and water, use hand sanitizer.  Do not smoke. Avoid being around people who are smoking (secondhand smoke).  Keep all follow-up visits as told by your doctor. This is important. Contact a doctor if:  You have a fever.  Your symptoms get worse.  Your symptoms do not get better within 10 days. Get help right away if:  You have a very bad headache.  You cannot stop throwing up (vomiting).  You have pain or swelling around your face or eyes.  You have trouble seeing.  You feel confused.  Your neck is stiff.  You have trouble breathing. This information is not intended to replace advice given to you by your health care provider. Make sure you discuss any questions you have with your health care provider. Document Released: 11/01/2007 Document Revised: 01/09/2016 Document Reviewed: 03/10/2015 Elsevier Interactive Patient Education  [employer].

## 2018-01-01 NOTE — Progress Notes (Signed)
Patient ID: Lynn Holt, female    DOB: 1975/10/28, 42 y.o.   MRN: [id number]  PCP: Birdie Sons, MD  Chief Complaint  Patient presents with  . Sinus Problem    Subjective:  HPI Lynn Holt is a 42 y.o. female for evaluation of possible sinusitis. Patient complains of symptoms of a URI, possible sinusitis. Symptoms include right ear pain, congestion, cough, sore throat, swollen glands and headache. Onset of symptoms was 5 days ago, unchanged since that time. History includes allergies and chronic sinusitis. She has remained afebrile. Last sinus infection over 1 month ago and feels that the symptoms never completely resolved.   Social History   Socioeconomic History  . Marital status: Married    Spouse name: Not on file  . Number of children: 0  . Years of education: Masters   . Highest education level: Not on file  Occupational History  . Not on file  Social Needs  . Financial resource strain: Not on file  . Food insecurity:    Worry: Not on file    Inability: Not on file  . Transportation needs:    Medical: Not on file    Non-medical: Not on file  Tobacco Use  . Smoking status: Never Smoker  . Smokeless tobacco: Never Used  Substance and Sexual Activity  . Alcohol use: Yes    Comment: occas  . Drug use: No  . Sexual activity: Yes    Birth control/protection: None  Lifestyle  . Physical activity:    Days per week: Not on file    Minutes per session: Not on file  . Stress: Not on file  Relationships  . Social connections:    Talks on phone: Not on file    Gets together: Not on file    Attends religious service: Not on file    Active member of club or organization: Not on file    Attends meetings of clubs or organizations: Not on file    Relationship status: Not on file  . Intimate partner violence:    Fear of current or ex partner: Not on file    Emotionally abused: Not on file    Physically abused: Not on file    Forced sexual activity: Not on  file  Other Topics Concern  . Not on file  Social History Narrative   Drinks 1 caffeine drink a day     Family History  Problem Relation Age of Onset  . Fibromyalgia Mother   . Rheum arthritis Father   . Heart attack Father   . Breast cancer Maternal Grandmother   . Colon cancer Maternal Grandmother   . Heart attack Paternal Grandfather   . Esophageal cancer Paternal Grandfather   . Breast cancer Maternal Aunt   . Neuropathy Neg Hx    Review of Systems Pertinent negatives listed in HPI Patient Active Problem List   Diagnosis Date Noted  . Right arm weakness 07/13/2016    Allergies  Allergen Reactions  . Morphine And Related     Other reaction(s): Fever, Skin Rashes  . Ultram  [Tramadol]     Prior to Admission medications   Medication Sig Start Date End Date Taking? Authorizing Provider  Cholecalciferol (VITAMIN D) 2000 units CAPS Take by mouth.   Yes [provider]  medroxyPROGESTERone (DEPO-PROVERA) 150 MG/ML injection Inject 1 mL (150 mg total) into the muscle every 3 (three) months. 10/30/17  Yes Gae Dry, MD  Multiple Vitamin (MULTIVITAMIN) capsule Take  1 capsule by mouth daily.   Yes [provider]  acetaminophen (TYLENOL) 325 MG tablet Take 2 tablets (650 mg total) by mouth every 6 (six) hours. Patient not taking: Reported on 11/08/2017 08/27/17   Lynn Jakob, MD  amoxicillin-clavulanate (AUGMENTIN) 875-125 MG tablet Take 1 tablet by mouth 2 (two) times daily. Patient not taking: Reported on 01/01/2018 11/26/17   Scot Jun, FNP  DULoxetine (CYMBALTA) 30 MG capsule Take 1 capsule (30 mg total) by mouth daily. Patient not taking: Reported on 01/01/2018 05/01/17   Melvenia Beam, MD  meloxicam (MOBIC) 15 MG tablet Take 1 tablet (15 mg total) by mouth daily. Patient not taking: Reported on 11/26/2017 08/27/17 08/27/18  Lynn Jakob, MD  oxyCODONE (ROXICODONE) 5 MG immediate release tablet Take 1 tablet (5 mg total) by mouth every 6 (six)  hours as needed for breakthrough pain. Patient not taking: Reported on 11/08/2017 08/27/17   Lynn Jakob, MD    Past Medical, Surgical Family and Social History reviewed and updated.    Objective:   Today's Vitals   01/01/18 1044  BP: (!) 130/100  Pulse: 84  Temp: 98.4 F (36.9 C)  SpO2: 100%  Weight: 162 lb (73.5 kg)    Wt Readings from Last 3 Encounters:  01/01/18 162 lb (73.5 kg)  11/26/17 162 lb (73.5 kg)  11/08/17 160 lb (72.6 kg)    Physical Exam  HENT:  Head: Head is with raccoon's eyes.  Right Ear: Hearing, tympanic membrane, external ear and ear canal normal.  Left Ear: Hearing, tympanic membrane, external ear and ear canal normal.  Nose: Mucosal edema and rhinorrhea present. Right sinus exhibits maxillary sinus tenderness.  Complains of eustachian tube pain right side  Cardiovascular: Normal rate, regular rhythm and normal pulses.  Pulmonary/Chest: Effort normal and breath sounds normal.  Lymphadenopathy:    She has cervical adenopathy.   Assessment & Plan:  1. Acute non-recurrent sinusitis, unspecified location, with significant nasal edema. Course of treatment as follows:  Meds ordered this encounter  Medications  . cefdinir (OMNICEF) 300 MG capsule    Sig: Take 2 capsules (600 mg total) by mouth daily.    Dispense:  20 capsule    Refill:  0  . predniSONE (DELTASONE) 20 MG tablet    Sig: Take 2 tablets (40 mg total) by mouth daily with breakfast for 5 days.    Dispense:  10 tablet    Refill:  0  . ipratropium (ATROVENT) 0.03 % nasal spray    Sig: Place 2 sprays into both nostrils 2 (two) times daily.    Dispense:  30 mL    Refill:  0     If symptoms worsen or do not improve, return for follow-up, follow-up with PCP, or at the emergency department if severity of symptoms warrant a higher level of care.     Carroll Sage. Kenton Kingfisher, MSN, FNP-C [facility]  [place], [place] [address] [phone]

## 2018-01-03 ENCOUNTER — Telehealth: Payer: Self-pay | Admitting: Emergency Medicine

## 2018-01-03 NOTE — Telephone Encounter (Signed)
Left message follow up call on visit with [facility]. 

## 2018-10-04 ENCOUNTER — Other Ambulatory Visit: Payer: Self-pay | Admitting: Obstetrics & Gynecology

## 2018-10-04 DIAGNOSIS — N920 Excessive and frequent menstruation with regular cycle: Secondary | ICD-10-CM

## 2019-01-13 ENCOUNTER — Other Ambulatory Visit: Payer: Self-pay

## 2019-01-13 ENCOUNTER — Telehealth: Payer: Self-pay | Admitting: Obstetrics & Gynecology

## 2019-01-13 ENCOUNTER — Other Ambulatory Visit: Payer: Self-pay | Admitting: Obstetrics & Gynecology

## 2019-01-13 DIAGNOSIS — N920 Excessive and frequent menstruation with regular cycle: Secondary | ICD-10-CM

## 2019-01-13 MED ORDER — MEDROXYPROGESTERONE ACETATE 150 MG/ML IM SUSY: 1.0000 mL | PREFILLED_SYRINGE | Freq: Once | INTRAMUSCULAR | 0 refills | Status: DC

## 2019-01-13 NOTE — Telephone Encounter (Signed)
Patient needs refill on depo, annual scheduled with [facility] 8/24.  [facility].

## 2019-01-13 NOTE — Telephone Encounter (Signed)
Can you prescribe depo for her she has annual scheduled with [facility]

## 2019-01-20 ENCOUNTER — Ambulatory Visit: Payer: 59 | Admitting: Obstetrics & Gynecology

## 2019-02-12 DIAGNOSIS — S96911A Strain of unspecified muscle and tendon at ankle and foot level, right foot, initial encounter: Secondary | ICD-10-CM | POA: Diagnosis not present

## 2019-04-01 ENCOUNTER — Telehealth: Payer: 59 | Admitting: Nurse Practitioner

## 2019-04-01 DIAGNOSIS — J329 Chronic sinusitis, unspecified: Secondary | ICD-10-CM

## 2019-04-01 MED ORDER — DOXYCYCLINE HYCLATE 100 MG PO TABS: 100.0000 mg | ORAL_TABLET | Freq: Two times a day (BID) | ORAL | 0 refills | Status: DC

## 2019-04-01 NOTE — Progress Notes (Signed)
I contacted patient to explain to her the mix up in todays evisit. I hope she will use [place] again in the future.

## 2019-04-01 NOTE — Progress Notes (Signed)
Patient is a 43 year old female having problems with sinus congestion, facial pain around the nose and face.  This is accompanied by headache.  Patient is also had the right eye swollen times.  Patient states that she gets this problem a few times during the year.  She usually has success with oral antibiotics and decongesting medications.  There is been no fever reported.  No blood in the mucus.  No recent sinus related surgeries.

## 2019-05-29 ENCOUNTER — Other Ambulatory Visit: Payer: Self-pay

## 2019-05-29 ENCOUNTER — Ambulatory Visit (INDEPENDENT_AMBULATORY_CARE_PROVIDER_SITE_OTHER): Payer: 59 | Admitting: Obstetrics & Gynecology

## 2019-05-29 ENCOUNTER — Encounter: Payer: Self-pay | Admitting: Obstetrics & Gynecology

## 2019-05-29 VITALS — BP 120/80 | Ht 64.0 in | Wt 164.0 lb

## 2019-05-29 DIAGNOSIS — Z01419 Encounter for gynecological examination (general) (routine) without abnormal findings: Secondary | ICD-10-CM | POA: Diagnosis not present

## 2019-05-29 DIAGNOSIS — N920 Excessive and frequent menstruation with regular cycle: Secondary | ICD-10-CM

## 2019-05-29 MED ORDER — MEDROXYPROGESTERONE ACETATE 150 MG/ML IM SUSY: 1.0000 mL | PREFILLED_SYRINGE | INTRAMUSCULAR | 3 refills | Status: DC

## 2019-05-29 NOTE — Progress Notes (Signed)
HPI:      [name] is a 43 y.o. G0P0000 who LMP was No LMP recorded. Patient has had an injection., she presents today for her annual examination. The patient has no complaints today. The patient is sexually active. Her last pap: approximate date 2018 and was normal and last mammogram: approximate date 2019 and was normal. The patient does perform self breast exams.  There is no notable family history of breast or ovarian cancer in her family.  The patient has regular exercise: yes.  The patient denies current symptoms of depression.    GYN History: Contraception: Depo-Provera injections  PMHx: Past Medical History:  Diagnosis Date  . Endometriosis   . Family history of adverse reaction to anesthesia    nausea  . Headache   . PONV (postoperative nausea and vomiting)    Past Surgical History:  Procedure Laterality Date  . APPENDECTOMY    . labrium repair    . OVARIAN CYST REMOVAL    . SHOULDER SURGERY    . TONSILLECTOMY    . ULNAR NERVE TRANSPOSITION Right 08/27/2017   Procedure: RIGHT ULNAR NERVE TRANSPOSITION AT THE ELBOW;  Surgeon: Milly Jakob, MD;  Location: [facility];  Service: Orthopedics;  Laterality: Right;   Family History  Problem Relation Age of Onset  . Fibromyalgia Mother   . Rheum arthritis Father   . Heart attack Father   . Breast cancer Maternal Grandmother   . Colon cancer Maternal Grandmother   . Heart attack Paternal Grandfather   . Esophageal cancer Paternal Grandfather   . Breast cancer Maternal Aunt   . Neuropathy Neg Hx    Social History   Tobacco Use  . Smoking status: Never Smoker  . Smokeless tobacco: Never Used  Substance Use Topics  . Alcohol use: Yes    Comment: occas  . Drug use: No    Current Outpatient Medications:  Marland Kitchen  Multiple Vitamin (MULTIVITAMIN) capsule, Take 1 capsule by mouth daily., Disp: , Rfl:  .  acetaminophen (TYLENOL) 325 MG tablet, Take 2 tablets (650 mg total) by mouth every 6 (six) hours.  (Patient not taking: Reported on 11/08/2017), Disp: , Rfl:  .  amoxicillin-clavulanate (AUGMENTIN) 875-125 MG tablet, Take 1 tablet by mouth 2 (two) times daily. (Patient not taking: Reported on 01/01/2018), Disp: 20 tablet, Rfl: 0 .  cefdinir (OMNICEF) 300 MG capsule, Take 2 capsules (600 mg total) by mouth daily. (Patient not taking: Reported on 05/29/2019), Disp: 20 capsule, Rfl: 0 .  Cholecalciferol (VITAMIN D) 2000 units CAPS, Take by mouth., Disp: , Rfl:  .  doxycycline (VIBRA-TABS) 100 MG tablet, Take 1 tablet (100 mg total) by mouth 2 (two) times daily. (Patient not taking: Reported on 05/29/2019), Disp: 14 tablet, Rfl: 0 .  DULoxetine (CYMBALTA) 30 MG capsule, Take 1 capsule (30 mg total) by mouth daily. (Patient not taking: Reported on 01/01/2018), Disp: 30 capsule, Rfl: 6 .  ipratropium (ATROVENT) 0.03 % nasal spray, Place 2 sprays into both nostrils 2 (two) times daily. (Patient not taking: Reported on 05/29/2019), Disp: 30 mL, Rfl: 0 .  medroxyPROGESTERone Acetate 150 MG/ML SUSY, Inject 1 mL (150 mg total) as directed once for 1 dose., Disp: 1 mL, Rfl: 0 .  oxyCODONE (ROXICODONE) 5 MG immediate release tablet, Take 1 tablet (5 mg total) by mouth every 6 (six) hours as needed for breakthrough pain. (Patient not taking: Reported on 05/29/2019), Disp: 20 tablet, Rfl: 0 Allergies: Morphine and related and Ultram  [  tramadol]  Review of Systems  Constitutional: Negative for chills, fever and malaise/fatigue.  HENT: Negative for congestion, sinus pain and sore throat.   Eyes: Negative for blurred vision and pain.  Respiratory: Negative for cough and wheezing.   Cardiovascular: Negative for chest pain and leg swelling.  Gastrointestinal: Negative for abdominal pain, constipation, diarrhea, heartburn, nausea and vomiting.  Genitourinary: Negative for dysuria, frequency, hematuria and urgency.  Musculoskeletal: Negative for back pain, joint pain, myalgias and neck pain.  Skin: Negative for itching  and rash.  Neurological: Negative for dizziness, tremors and weakness.  Endo/Heme/Allergies: Does not bruise/bleed easily.  Psychiatric/Behavioral: Negative for depression. The patient is not nervous/anxious and does not have insomnia.     Objective: BP 120/80   Ht 5\' 4"  (1.626 m)   Wt 164 lb (74.4 kg)   BMI 28.15 kg/m   Filed Weights   05/29/19 1530  Weight: 164 lb (74.4 kg)   Body mass index is 28.15 kg/m. Physical Exam Constitutional:      General: She is not in acute distress.    Appearance: She is well-developed.  Genitourinary:     Pelvic exam was performed with patient supine.     Urethra, bladder, vagina, uterus and rectum normal.     No lesions in the vagina.     No vaginal bleeding.     No cervical motion tenderness, friability, lesion or polyp.     Uterus is mobile.     Uterus is not enlarged.     No uterine mass detected.    Uterus is retroverted.     No right or left adnexal mass present.     Right adnexa not tender.     Left adnexa not tender.  HENT:     Head: Normocephalic and atraumatic. No laceration.     Right Ear: Hearing normal.     Left Ear: Hearing normal.     Mouth/Throat:     Pharynx: Uvula midline.  Eyes:     Pupils: Pupils are equal, round, and reactive to light.  Neck:     Thyroid: No thyromegaly.  Cardiovascular:     Rate and Rhythm: Normal rate and regular rhythm.     Heart sounds: No murmur. No friction rub. No gallop.   Pulmonary:     Effort: Pulmonary effort is normal. No respiratory distress.     Breath sounds: Normal breath sounds. No wheezing.  Chest:     Breasts:        Right: No mass, skin change or tenderness.        Left: No mass, skin change or tenderness.  Abdominal:     General: Bowel sounds are normal. There is no distension.     Palpations: Abdomen is soft.     Tenderness: There is no abdominal tenderness. There is no rebound.  Musculoskeletal:        General: Normal range of motion.     Cervical back: Normal  range of motion and neck supple.  Neurological:     Mental Status: She is alert and oriented to person, place, and time.     Cranial Nerves: No cranial nerve deficit.  Skin:    General: Skin is warm and dry.  Psychiatric:        Judgment: Judgment normal.  Vitals reviewed.     Assessment:  ANNUAL EXAM 1. Women's annual routine gynecological examination      Screening Plan:  1.  Cervical Screening-  Pap smear schedule reviewed with patient  2. Breast screening- Exam annually and mammogram>40 planned   3. Colonoscopy every 10 years, Hemoccult testing - after age 1  4. Labs next year due  5. Counseling for contraception: Depo-Provera    F/U  Return in about 1 year (around 05/28/2020) for Annual.  Barnett Applebaum, MD, [facility], [facility] 05/29/2019  4:11 PM

## 2019-07-08 NOTE — Telephone Encounter (Signed)
error 

## 2019-09-04 ENCOUNTER — Other Ambulatory Visit: Payer: Self-pay | Admitting: Obstetrics & Gynecology

## 2019-09-10 ENCOUNTER — Telehealth: Payer: Self-pay

## 2019-09-10 NOTE — Telephone Encounter (Signed)
Left message to advise her to schedule mammogram

## 2019-09-10 NOTE — Telephone Encounter (Signed)
-----   Message from Gae Dry, MD sent at 09/04/2019 10:31 AM EDT ----- Regarding: MMG Received notice she has not received MMG yet as ordered at her Annual. Please check and encourage her to do this, and document conversation.

## 2019-11-18 ENCOUNTER — Other Ambulatory Visit: Payer: Self-pay

## 2019-11-18 ENCOUNTER — Other Ambulatory Visit: Payer: Self-pay | Admitting: Emergency Medicine

## 2019-11-18 ENCOUNTER — Ambulatory Visit
Admission: RE | Admit: 2019-11-18 | Discharge: 2019-11-18 | Disposition: A | Discharge status: Home / Self Care | Payer: PRIVATE HEALTH INSURANCE | Source: Ambulatory Visit | Attending: Emergency Medicine | Admitting: Emergency Medicine

## 2019-11-18 ENCOUNTER — Ambulatory Visit
Admission: RE | Admit: 2019-11-18 | Discharge: 2019-11-18 | Disposition: A | Discharge status: Home / Self Care | Payer: PRIVATE HEALTH INSURANCE | Attending: Emergency Medicine | Admitting: Emergency Medicine

## 2019-11-18 DIAGNOSIS — T1490XA Injury, unspecified, initial encounter: Secondary | ICD-10-CM

## 2019-12-04 ENCOUNTER — Other Ambulatory Visit: Payer: Self-pay | Admitting: Obstetrics & Gynecology

## 2019-12-04 DIAGNOSIS — Z1239 Encounter for other screening for malignant neoplasm of breast: Secondary | ICD-10-CM

## 2020-05-31 ENCOUNTER — Other Ambulatory Visit: Payer: Self-pay | Admitting: Obstetrics & Gynecology

## 2020-05-31 DIAGNOSIS — N920 Excessive and frequent menstruation with regular cycle: Secondary | ICD-10-CM

## 2020-07-12 ENCOUNTER — Other Ambulatory Visit: Payer: Self-pay

## 2020-07-12 ENCOUNTER — Encounter: Payer: Self-pay | Admitting: Obstetrics & Gynecology

## 2020-07-12 ENCOUNTER — Other Ambulatory Visit (HOSPITAL_COMMUNITY)
Admission: RE | Admit: 2020-07-12 | Discharge: 2020-07-12 | Disposition: A | Discharge status: Home / Self Care | Payer: 59 | Source: Ambulatory Visit | Attending: Obstetrics & Gynecology | Admitting: Obstetrics & Gynecology

## 2020-07-12 ENCOUNTER — Other Ambulatory Visit: Payer: Self-pay | Admitting: Obstetrics & Gynecology

## 2020-07-12 ENCOUNTER — Ambulatory Visit (INDEPENDENT_AMBULATORY_CARE_PROVIDER_SITE_OTHER): Payer: 59 | Admitting: Obstetrics & Gynecology

## 2020-07-12 VITALS — BP 100/60 | Ht 62.0 in | Wt 165.0 lb

## 2020-07-12 DIAGNOSIS — E7800 Pure hypercholesterolemia, unspecified: Secondary | ICD-10-CM

## 2020-07-12 DIAGNOSIS — Z3042 Encounter for surveillance of injectable contraceptive: Secondary | ICD-10-CM

## 2020-07-12 DIAGNOSIS — Z1329 Encounter for screening for other suspected endocrine disorder: Secondary | ICD-10-CM

## 2020-07-12 DIAGNOSIS — Z01419 Encounter for gynecological examination (general) (routine) without abnormal findings: Secondary | ICD-10-CM | POA: Diagnosis not present

## 2020-07-12 DIAGNOSIS — Z124 Encounter for screening for malignant neoplasm of cervix: Secondary | ICD-10-CM | POA: Diagnosis not present

## 2020-07-12 DIAGNOSIS — E559 Vitamin D deficiency, unspecified: Secondary | ICD-10-CM

## 2020-07-12 DIAGNOSIS — Z131 Encounter for screening for diabetes mellitus: Secondary | ICD-10-CM

## 2020-07-12 DIAGNOSIS — N920 Excessive and frequent menstruation with regular cycle: Secondary | ICD-10-CM

## 2020-07-12 MED ORDER — MEDROXYPROGESTERONE ACETATE 150 MG/ML IM SUSY: PREFILLED_SYRINGE | INTRAMUSCULAR | 3 refills | Status: DC

## 2020-07-12 NOTE — Patient Instructions (Signed)
PAP every three years Mammogram every year Labs today  Thank you for choosing [facility]. As part of our ongoing efforts to improve patient experience, we would appreciate your feedback. Please fill out the short survey that you will receive by mail or MyChart. Your opinion is important to [place]! - Dr. Kenton Kingfisher

## 2020-07-12 NOTE — Progress Notes (Signed)
HPI:      [name] is a 45 y.o. G0P0000 who LMP was Patient's last menstrual period was 06/08/2020., she presents today for her annual examination. The patient has no complaints today. The patient is sexually active. Her last pap: approximate date 2018 and was normal and last mammogram: approximate date 2020 and was normal. The patient does perform self breast exams.  There is no notable family history of breast or ovarian cancer in her family.  The patient has regular exercise: yes.  The patient denies current symptoms of depression other than stress and grief surrounding sick family members.   GYN History: Contraception: Depo-Provera injections  PMHx: Past Medical History:  Diagnosis Date  . Endometriosis   . Family history of adverse reaction to anesthesia    nausea  . Headache   . PONV (postoperative nausea and vomiting)    Past Surgical History:  Procedure Laterality Date  . APPENDECTOMY    . labrium repair    . OVARIAN CYST REMOVAL    . SHOULDER SURGERY    . TONSILLECTOMY    . ULNAR NERVE TRANSPOSITION Right 08/27/2017   Procedure: RIGHT ULNAR NERVE TRANSPOSITION AT THE ELBOW;  Surgeon: Milly Jakob, MD;  Location: [place];  Service: Orthopedics;  Laterality: Right;   Family History  Problem Relation Age of Onset  . Fibromyalgia Mother   . Rheum arthritis Father   . Heart attack Father   . Breast cancer Maternal Grandmother   . Colon cancer Maternal Grandmother   . Heart attack Paternal Grandfather   . Esophageal cancer Paternal Grandfather   . Breast cancer Maternal Aunt   . Neuropathy Neg Hx    Social History   Tobacco Use  . Smoking status: Never Smoker  . Smokeless tobacco: Never Used  Vaping Use  . Vaping Use: Never used  Substance Use Topics  . Alcohol use: Yes    Comment: occas  . Drug use: No    Current Outpatient Medications:  Marland Kitchen  Multiple Vitamin (MULTIVITAMIN) capsule, Take 1 capsule by mouth daily., Disp: , Rfl:  .   medroxyPROGESTERone Acetate 150 MG/ML SUSY, INJECT 1 ML AS DIRECTED EVERY 3 MONTHS., Disp: 1 mL, Rfl: 3 Allergies: Morphine and related and Ultram  [tramadol]  Review of Systems  Constitutional: Positive for malaise/fatigue. Negative for chills and fever.  HENT: Negative for congestion, sinus pain and sore throat.   Eyes: Negative for blurred vision and pain.  Respiratory: Negative for cough and wheezing.   Cardiovascular: Negative for chest pain and leg swelling.  Gastrointestinal: Negative for abdominal pain, constipation, diarrhea, heartburn, nausea and vomiting.  Genitourinary: Positive for frequency. Negative for dysuria, hematuria and urgency.  Musculoskeletal: Negative for back pain, joint pain, myalgias and neck pain.  Skin: Negative for itching and rash.  Neurological: Negative for dizziness, tremors and weakness.  Endo/Heme/Allergies: Does not bruise/bleed easily.  Psychiatric/Behavioral: Negative for depression. The patient is not nervous/anxious and does not have insomnia.     Objective: BP 100/60   Ht 5\' 2"  (1.575 m)   Wt 165 lb (74.8 kg)   LMP 06/08/2020   BMI 30.18 kg/m   Filed Weights   07/12/20 0855  Weight: 165 lb (74.8 kg)   Body mass index is 30.18 kg/m. Physical Exam Constitutional:      General: She is not in acute distress.    Appearance: She is well-developed and well-nourished.  Genitourinary:     Vagina, uterus and rectum normal.  There is no rash or lesion on the right labia.     There is no rash or lesion on the left labia.    No lesions in the vagina.     No vaginal bleeding.      Right Adnexa: not tender and no mass present.    Left Adnexa: not tender and no mass present.    No cervical motion tenderness, friability, lesion or polyp.     Uterus is mobile.     Uterus is not enlarged.     No uterine mass detected.    Uterus is midaxial and retroverted.     Pelvic exam was performed with patient in the lithotomy position.  Breasts:      Right: No mass, skin change or tenderness.     Left: No mass, skin change or tenderness.    HENT:     Head: Normocephalic and atraumatic. No laceration.     Right Ear: Hearing normal.     Left Ear: Hearing normal.     Nose: No epistaxis or foreign body.     Mouth/Throat:     Mouth: Oropharynx is clear and moist and mucous membranes are normal.     Pharynx: Uvula midline.  Eyes:     Pupils: Pupils are equal, round, and reactive to light.  Neck:     Thyroid: No thyromegaly.  Cardiovascular:     Rate and Rhythm: Normal rate and regular rhythm.     Heart sounds: No murmur heard. No friction rub. No gallop.   Pulmonary:     Effort: Pulmonary effort is normal. No respiratory distress.     Breath sounds: Normal breath sounds. No wheezing.  Abdominal:     General: Bowel sounds are normal. There is no distension.     Palpations: Abdomen is soft.     Tenderness: There is no abdominal tenderness. There is no rebound.  Musculoskeletal:        General: Normal range of motion.     Cervical back: Normal range of motion and neck supple.  Neurological:     Mental Status: She is alert and oriented to person, place, and time.     Cranial Nerves: No cranial nerve deficit.  Skin:    General: Skin is warm and dry.  Psychiatric:        Mood and Affect: Mood and affect normal.        Judgment: Judgment normal.  Vitals reviewed.     Assessment:  ANNUAL EXAM 1. Women's annual routine gynecological examination   2. Screening for cervical cancer   3. Encounter for surveillance of injectable contraceptive   4. Vitamin D deficiency   5. Hypercholesteremia   6. Screening for thyroid disorder   7. Screening for diabetes mellitus   8. Menorrhagia with regular cycle      Screening Plan:            1.  Cervical Screening-  Pap smear done today  2. Breast screening- Exam annually and mammogram>40 planned   3. Labs Ordered today  4. Counseling for contraception: Depo-Provera -  medroxyPROGESTERone Acetate 150 MG/ML SUSY; INJECT 1 ML AS DIRECTED EVERY 3 MONTHS.  Dispense: 1 mL; Refill: 3      F/U  Return in about 1 year (around 07/12/2021) for Annual.  Barnett Applebaum, MD, [facility], [facility] 07/12/2020  9:31 AM

## 2020-07-13 LAB — LIPID PANEL
Chol/HDL Ratio: 3.2 ratio (ref 0.0–4.4)
Cholesterol, Total: 265 mg/dL — ABNORMAL HIGH (ref 100–199)
HDL: 83 mg/dL (ref >39)
LDL Chol Calc (NIH): 165 mg/dL — ABNORMAL HIGH (ref 0–99)
Triglycerides: 101 mg/dL (ref 0–149)
VLDL Cholesterol Cal: 17 mg/dL (ref 5–40)

## 2020-07-13 LAB — VITAMIN D 25 HYDROXY (VIT D DEFICIENCY, FRACTURES): Vit D, 25-Hydroxy: 33.4 ng/mL (ref 30.0–100.0)

## 2020-07-13 LAB — CYTOLOGY - PAP
Comment: NEGATIVE
Diagnosis: NEGATIVE
High risk HPV: NEGATIVE

## 2020-07-13 LAB — TSH: TSH: 1.32 u[IU]/mL (ref 0.450–4.500)

## 2020-07-13 LAB — GLUCOSE, FASTING: Glucose, Plasma: 88 mg/dL (ref 65–99)

## 2020-08-26 ENCOUNTER — Other Ambulatory Visit: Payer: Self-pay | Admitting: Emergency Medicine

## 2020-08-26 ENCOUNTER — Telehealth: Payer: 59 | Admitting: Emergency Medicine

## 2020-08-26 DIAGNOSIS — R0981 Nasal congestion: Secondary | ICD-10-CM

## 2020-08-26 MED ORDER — AMOXICILLIN-POT CLAVULANATE 875-125 MG PO TABS: 1.0000 | ORAL_TABLET | Freq: Two times a day (BID) | ORAL | 0 refills | Status: DC

## 2020-08-26 MED ORDER — PREDNISONE 10 MG PO TABS: 10.0000 mg | ORAL_TABLET | Freq: Every day | ORAL | 0 refills | Status: DC

## 2020-08-26 NOTE — Progress Notes (Signed)
We are sorry that you are not feeling well.  Here is how we plan to help!  Based on what you have shared with me it looks like you have sinusitis.  Sinusitis is inflammation and infection in the sinus cavities of the head.  Based on your presentation I believe you most likely have Acute Bacterial Sinusitis.  This is an infection caused by bacteria and is treated with antibiotics. I have prescribed Augmentin 875mg /125mg  one tablet twice daily with food, for 7 days. You may use an oral decongestant such as Mucinex D or if you have glaucoma or high blood pressure use plain Mucinex. Saline nasal spray help and can safely be used as often as needed for congestion.  If you develop worsening sinus pain, fever or notice severe headache and vision changes, or if symptoms are not better after completion of antibiotic, please schedule an appointment with a health care provider.    I've sent a short course of prednisone as well.  Sinus infections are not as easily transmitted as other respiratory infection, however we still recommend that you avoid close contact with loved ones, especially the very young and elderly.  Remember to wash your hands thoroughly throughout the day as this is the number one way to prevent the spread of infection!  Home Care:  Only take medications as instructed by your medical team.  Complete the entire course of an antibiotic.  Do not take these medications with alcohol.  A steam or ultrasonic humidifier can help congestion.  You can place a towel over your head and breathe in the steam from hot water coming from a faucet.  Avoid close contacts especially the very young and the elderly.  Cover your mouth when you cough or sneeze.  Always remember to wash your hands.  Get Help Right Away If:  You develop worsening fever or sinus pain.  You develop a severe head ache or visual changes.  Your symptoms persist after you have completed your treatment plan.  Make sure  you  Understand these instructions.  Will watch your condition.  Will get help right away if you are not doing well or get worse.  Your e-visit answers were reviewed by a board certified advanced clinical practitioner to complete your personal care plan.  Depending on the condition, your plan could have included both over the counter or prescription medications.  If there is a problem please reply  once you have received a response from your provider.  Your safety is important to [place].  If you have drug allergies check your prescription carefully.    You can use MyChart to ask questions about today's visit, request a non-urgent call back, or ask for a work or school excuse for 24 hours related to this e-Visit. If it has been greater than 24 hours you will need to follow up with your provider, or enter a new e-Visit to address those concerns.  You will get an e-mail in the next two days asking about your experience.  I hope that your e-visit has been valuable and will speed your recovery. Thank you for using e-visits.   Approximately 5 minutes was used in reviewing the patient's chart, questionnaire, prescribing medications, and documentation.

## 2020-09-20 ENCOUNTER — Ambulatory Visit (INDEPENDENT_AMBULATORY_CARE_PROVIDER_SITE_OTHER): Payer: 59 | Admitting: Family Medicine

## 2020-09-20 ENCOUNTER — Other Ambulatory Visit: Payer: Self-pay

## 2020-09-20 VITALS — BP 124/90 | HR 94 | Ht 62.0 in | Wt 171.8 lb

## 2020-09-20 DIAGNOSIS — R1011 Right upper quadrant pain: Secondary | ICD-10-CM

## 2020-09-20 DIAGNOSIS — R110 Nausea: Secondary | ICD-10-CM

## 2020-09-20 DIAGNOSIS — E559 Vitamin D deficiency, unspecified: Secondary | ICD-10-CM | POA: Insufficient documentation

## 2020-09-20 NOTE — Progress Notes (Signed)
Established patient visit   Patient: Lynn Holt   DOB: [birth year]   45 y.o. Female  MRN: [id number] Visit Date: 09/20/2020  Today's healthcare provider: Lelon Huh, MD   Chief Complaint  Patient presents with  . Abdominal Pain   Subjective    Abdominal Pain This is a new problem. The current episode started more than 1 month ago. The onset quality is sudden. The problem occurs constantly. Duration: Constant. The problem has been gradually worsening. The pain is located in the RUQ. The pain is at a severity of 5/10. The pain is moderate. The quality of the pain is sharp (Feels like a knot). The abdominal pain radiates to the right shoulder. Associated symptoms include anorexia, nausea, vomiting and weight loss. Pertinent negatives include no constipation, diarrhea, dysuria, fever, frequency or hematuria. The pain is aggravated by eating. The pain is relieved by nothing. Treatments tried: Heating and ibuprofen. The treatment provided no relief. There is no history of abdominal surgery, Crohn's disease, gallstones, GERD or irritable bowel syndrome.    Patient states that symptoms began in November. Previously she had identical RUQ pain intermittently over the last 10-12 years with negative GI imaging. In past symptoms had always resolved after a few weeks or so, but has not been persistent for last 5 months. Previously was only aggravated by fatty foods, but now anything she eats or drinks will aggravate pain.      Medications: Outpatient Medications Prior to Visit  Medication Sig  . medroxyPROGESTERone Acetate 150 MG/ML SUSY INJECT 1 ML AS DIRECTED EVERY 3 MONTHS.  . Multiple Vitamin (MULTIVITAMIN) capsule Take 1 capsule by mouth daily.  Marland Kitchen amoxicillin-clavulanate (AUGMENTIN) 875-125 MG tablet TAKE 1 TABLET BY MOUTH EVERY 12 HOURS. (Patient not taking: Reported on 09/20/2020)  . predniSONE (DELTASONE) 10 MG tablet TAKE 1 TABLET (10 MG TOTAL) BY MOUTH DAILY WITH BREAKFAST.  (Patient not taking: Reported on 09/20/2020)   No facility-administered medications prior to visit.    Review of Systems  Constitutional: Positive for weight loss. Negative for fever.  Gastrointestinal: Positive for abdominal pain, anorexia, nausea and vomiting. Negative for constipation and diarrhea.  Genitourinary: Negative for dysuria, frequency and hematuria.       Objective    BP 124/90 (BP Location: Left Arm, Patient Position: Sitting, Cuff Size: Large)   Pulse 94   Ht 5\' 2"  (1.575 m)   Wt 171 lb 12.8 oz (77.9 kg)   BMI 31.42 kg/m     Physical Exam    General: Appearance:    Underweight female in no acute distress  Eyes:    PERRL, conjunctiva/corneas clear, EOM's intact       Lungs:     Clear to auscultation bilaterally, respirations unlabored  Heart:    Normal heart rate. Normal rhythm. No murmurs, rubs, or gallops.   MS:   All extremities are intact.   GI:   Tender RUQ, no masses, no rebound or guarding. Mild tenderness similar spot of right side of back.         Assessment & Plan     1. RUQ pain  - Comprehensive metabolic panel - CBC - Amylase - US Abdomen Limited RUQ (LIVER/GB); Future  2. Nausea Symptoms intermittent and usually self limited over the last 10-12 years, but no persistent occurring nearly every day for the last 5 months.   If labs and u/s negative will proceed to advanced. Imaging.         The entirety  of the information documented in the History of Present Illness, Review of Systems and Physical Exam were personally obtained by me. Portions of this information were initially documented by the CMA and reviewed by me for thoroughness and accuracy.      Lelon Huh, MD  [facility] [phone] (phone) [phone] (fax)  [place]

## 2020-09-20 NOTE — Patient Instructions (Signed)
.   Please review the attached list of medications and notify my office if there are any errors.   . Please bring all of your medications to every appointment so we can make sure that our medication list is the same as yours.   

## 2020-09-21 LAB — COMPREHENSIVE METABOLIC PANEL
ALT: 14 IU/L (ref 0–32)
AST: 16 IU/L (ref 0–40)
Albumin/Globulin Ratio: 2.3 — ABNORMAL HIGH (ref 1.2–2.2)
Albumin: 4.9 g/dL — ABNORMAL HIGH (ref 3.8–4.8)
Alkaline Phosphatase: 61 IU/L (ref 44–121)
BUN/Creatinine Ratio: 11 (ref 9–23)
BUN: 9 mg/dL (ref 6–24)
Bilirubin Total: 0.5 mg/dL (ref 0.0–1.2)
CO2: 19 mmol/L — ABNORMAL LOW (ref 20–29)
Calcium: 9.4 mg/dL (ref 8.7–10.2)
Chloride: 103 mmol/L (ref 96–106)
Creatinine, Ser: 0.85 mg/dL (ref 0.57–1.00)
Globulin, Total: 2.1 g/dL (ref 1.5–4.5)
Glucose: 85 mg/dL (ref 65–99)
Potassium: 4.4 mmol/L (ref 3.5–5.2)
Sodium: 139 mmol/L (ref 134–144)
Total Protein: 7 g/dL (ref 6.0–8.5)
eGFR: 87 mL/min/{1.73_m2} (ref >59)

## 2020-09-21 LAB — CBC
Hematocrit: 40.7 % (ref 34.0–46.6)
Hemoglobin: 13.7 g/dL (ref 11.1–15.9)
MCH: 29 pg (ref 26.6–33.0)
MCHC: 33.7 g/dL (ref 31.5–35.7)
MCV: 86 fL (ref 79–97)
Platelets: 222 10*3/uL (ref 150–450)
RBC: 4.73 x10E6/uL (ref 3.77–5.28)
RDW: 13.2 % (ref 11.7–15.4)
WBC: 11 10*3/uL — ABNORMAL HIGH (ref 3.4–10.8)

## 2020-09-21 LAB — AMYLASE: Amylase: 77 U/L (ref 31–110)

## 2020-09-22 ENCOUNTER — Other Ambulatory Visit: Payer: Self-pay

## 2020-09-22 ENCOUNTER — Ambulatory Visit
Admission: RE | Admit: 2020-09-22 | Discharge: 2020-09-22 | Disposition: A | Discharge status: Home / Self Care | Payer: 59 | Source: Ambulatory Visit | Attending: Family Medicine | Admitting: Family Medicine

## 2020-09-22 DIAGNOSIS — R1011 Right upper quadrant pain: Secondary | ICD-10-CM | POA: Insufficient documentation

## 2020-09-22 DIAGNOSIS — R109 Unspecified abdominal pain: Secondary | ICD-10-CM | POA: Diagnosis not present

## 2020-09-24 ENCOUNTER — Other Ambulatory Visit: Payer: Self-pay

## 2020-09-24 DIAGNOSIS — G8929 Other chronic pain: Secondary | ICD-10-CM

## 2020-09-24 DIAGNOSIS — R1013 Epigastric pain: Secondary | ICD-10-CM

## 2020-09-24 NOTE — Progress Notes (Signed)
Ct abdomen w/contrast ordered per Dr. Caryn Section. Patient has been advised.

## 2020-09-28 ENCOUNTER — Encounter: Payer: Self-pay | Admitting: Family Medicine

## 2020-09-28 ENCOUNTER — Other Ambulatory Visit: Payer: Self-pay

## 2020-09-28 MED ORDER — ONDANSETRON 4 MG PO TBDP
4.0000 mg | ORAL_TABLET | Freq: Three times a day (TID) | ORAL | 0 refills | Status: DC | PRN
  Filled 2020-09-28: qty 20, 7d supply, fill #0

## 2020-09-28 NOTE — Addendum Note (Signed)
Addended by: Lelon Huh E on: 09/28/2020 11:19 AM   Modules accepted: Orders

## 2020-10-04 ENCOUNTER — Ambulatory Visit
Admission: RE | Admit: 2020-10-04 | Discharge: 2020-10-04 | Disposition: A | Discharge status: Home / Self Care | Payer: 59 | Source: Ambulatory Visit | Attending: Family Medicine | Admitting: Family Medicine

## 2020-10-04 ENCOUNTER — Other Ambulatory Visit: Payer: Self-pay

## 2020-10-04 DIAGNOSIS — M549 Dorsalgia, unspecified: Secondary | ICD-10-CM | POA: Diagnosis not present

## 2020-10-04 DIAGNOSIS — G8929 Other chronic pain: Secondary | ICD-10-CM | POA: Diagnosis not present

## 2020-10-04 DIAGNOSIS — K449 Diaphragmatic hernia without obstruction or gangrene: Secondary | ICD-10-CM | POA: Diagnosis not present

## 2020-10-04 DIAGNOSIS — R1013 Epigastric pain: Secondary | ICD-10-CM | POA: Diagnosis not present

## 2020-10-04 DIAGNOSIS — R6881 Early satiety: Secondary | ICD-10-CM | POA: Diagnosis not present

## 2020-10-04 DIAGNOSIS — K769 Liver disease, unspecified: Secondary | ICD-10-CM | POA: Diagnosis not present

## 2020-10-04 MED ORDER — IOHEXOL 300 MG/ML  SOLN
85.0000 mL | Freq: Once | INTRAMUSCULAR | Status: AC | PRN
  Administered 2020-10-04: 85 mL via INTRAVENOUS

## 2020-10-05 ENCOUNTER — Telehealth: Payer: Self-pay

## 2020-10-05 DIAGNOSIS — R1011 Right upper quadrant pain: Secondary | ICD-10-CM

## 2020-10-05 NOTE — Telephone Encounter (Signed)
-----   Message from Birdie Sons, MD sent at 10/04/2020  5:32 PM EDT ----- CT shows small hiatal hernia but his would not explain pain. Recommend referral to GI for further evaluation.

## 2020-10-11 ENCOUNTER — Ambulatory Visit: Admission: RE | Admit: 2020-10-11 | Payer: 59 | Source: Ambulatory Visit

## 2020-10-18 ENCOUNTER — Other Ambulatory Visit (HOSPITAL_BASED_OUTPATIENT_CLINIC_OR_DEPARTMENT_OTHER): Payer: Self-pay

## 2020-10-18 ENCOUNTER — Encounter: Payer: Self-pay | Admitting: Gastroenterology

## 2020-11-16 ENCOUNTER — Other Ambulatory Visit: Payer: Self-pay

## 2020-11-16 MED FILL — Medroxyprogesterone Acetate IM Susp Prefilled Syr 150 MG/ML: INTRAMUSCULAR | 90 days supply | Qty: 1 | Fill #0 | Status: AC

## 2021-02-08 ENCOUNTER — Other Ambulatory Visit: Payer: Self-pay

## 2021-02-08 MED FILL — Medroxyprogesterone Acetate IM Susp Prefilled Syr 150 MG/ML: INTRAMUSCULAR | 90 days supply | Qty: 1 | Fill #1 | Status: AC

## 2021-02-24 ENCOUNTER — Other Ambulatory Visit: Payer: Self-pay | Admitting: Obstetrics & Gynecology

## 2021-02-24 DIAGNOSIS — Z1231 Encounter for screening mammogram for malignant neoplasm of breast: Secondary | ICD-10-CM

## 2021-03-10 ENCOUNTER — Other Ambulatory Visit: Payer: Self-pay

## 2021-03-10 ENCOUNTER — Ambulatory Visit
Admission: RE | Admit: 2021-03-10 | Discharge: 2021-03-10 | Disposition: A | Discharge status: Home / Self Care | Payer: 59 | Source: Ambulatory Visit | Attending: Obstetrics & Gynecology | Admitting: Obstetrics & Gynecology

## 2021-03-10 DIAGNOSIS — Z1231 Encounter for screening mammogram for malignant neoplasm of breast: Secondary | ICD-10-CM | POA: Insufficient documentation

## 2021-03-16 ENCOUNTER — Other Ambulatory Visit: Payer: Self-pay | Admitting: Obstetrics & Gynecology

## 2021-03-16 DIAGNOSIS — N6320 Unspecified lump in the left breast, unspecified quadrant: Secondary | ICD-10-CM

## 2021-03-16 DIAGNOSIS — R928 Other abnormal and inconclusive findings on diagnostic imaging of breast: Secondary | ICD-10-CM

## 2021-03-16 DIAGNOSIS — N6310 Unspecified lump in the right breast, unspecified quadrant: Secondary | ICD-10-CM

## 2021-03-17 ENCOUNTER — Ambulatory Visit
Admission: RE | Admit: 2021-03-17 | Discharge: 2021-03-17 | Disposition: A | Discharge status: Home / Self Care | Payer: 59 | Source: Ambulatory Visit | Attending: Obstetrics & Gynecology | Admitting: Obstetrics & Gynecology

## 2021-03-17 ENCOUNTER — Other Ambulatory Visit: Payer: Self-pay

## 2021-03-17 DIAGNOSIS — N6320 Unspecified lump in the left breast, unspecified quadrant: Secondary | ICD-10-CM | POA: Diagnosis not present

## 2021-03-17 DIAGNOSIS — R928 Other abnormal and inconclusive findings on diagnostic imaging of breast: Secondary | ICD-10-CM

## 2021-03-17 DIAGNOSIS — N6310 Unspecified lump in the right breast, unspecified quadrant: Secondary | ICD-10-CM

## 2021-03-17 DIAGNOSIS — N6012 Diffuse cystic mastopathy of left breast: Secondary | ICD-10-CM | POA: Diagnosis not present

## 2021-03-17 DIAGNOSIS — R922 Inconclusive mammogram: Secondary | ICD-10-CM | POA: Diagnosis not present

## 2021-03-28 DIAGNOSIS — R000 Tachycardia, unspecified: Secondary | ICD-10-CM | POA: Insufficient documentation

## 2021-03-28 DIAGNOSIS — R5383 Other fatigue: Secondary | ICD-10-CM

## 2021-03-28 DIAGNOSIS — G8929 Other chronic pain: Secondary | ICD-10-CM | POA: Insufficient documentation

## 2021-03-28 DIAGNOSIS — R1011 Right upper quadrant pain: Secondary | ICD-10-CM | POA: Insufficient documentation

## 2021-03-28 DIAGNOSIS — G43909 Migraine, unspecified, not intractable, without status migrainosus: Secondary | ICD-10-CM | POA: Insufficient documentation

## 2021-03-28 DIAGNOSIS — Z789 Other specified health status: Secondary | ICD-10-CM | POA: Insufficient documentation

## 2021-03-28 HISTORY — DX: Other fatigue: R53.83

## 2021-03-28 HISTORY — DX: Tachycardia, unspecified: R00.0

## 2021-03-29 ENCOUNTER — Other Ambulatory Visit: Payer: Self-pay

## 2021-03-29 ENCOUNTER — Encounter: Payer: Self-pay | Admitting: Gastroenterology

## 2021-03-29 ENCOUNTER — Ambulatory Visit (INDEPENDENT_AMBULATORY_CARE_PROVIDER_SITE_OTHER): Payer: 59 | Admitting: Gastroenterology

## 2021-03-29 VITALS — BP 133/92 | HR 86 | Temp 97.4°F | Ht 62.0 in | Wt 130.8 lb

## 2021-03-29 DIAGNOSIS — G8929 Other chronic pain: Secondary | ICD-10-CM

## 2021-03-29 DIAGNOSIS — K219 Gastro-esophageal reflux disease without esophagitis: Secondary | ICD-10-CM | POA: Diagnosis not present

## 2021-03-29 DIAGNOSIS — R195 Other fecal abnormalities: Secondary | ICD-10-CM

## 2021-03-29 DIAGNOSIS — R140 Abdominal distension (gaseous): Secondary | ICD-10-CM | POA: Diagnosis not present

## 2021-03-29 DIAGNOSIS — R1011 Right upper quadrant pain: Secondary | ICD-10-CM | POA: Diagnosis not present

## 2021-03-29 DIAGNOSIS — Z1211 Encounter for screening for malignant neoplasm of colon: Secondary | ICD-10-CM | POA: Diagnosis not present

## 2021-03-29 MED ORDER — OMEPRAZOLE 40 MG PO CPDR
40.0000 mg | DELAYED_RELEASE_CAPSULE | Freq: Every day | ORAL | 2 refills | Status: DC
Start: 2021-03-29 — End: 2021-05-25
  Filled 2021-03-29: qty 30, 30d supply, fill #0
  Filled 2021-04-28: qty 30, 30d supply, fill #1

## 2021-03-29 MED ORDER — OMEPRAZOLE 40 MG PO CPDR
40.0000 mg | DELAYED_RELEASE_CAPSULE | Freq: Every day | ORAL | 0 refills | Status: DC
  Filled 2021-03-29: qty 30, 30d supply, fill #0

## 2021-03-29 MED ORDER — PEG 3350-KCL-NA BICARB-NACL 420 G PO SOLR
4000.0000 mL | Freq: Once | ORAL | 0 refills | Status: AC
  Filled 2021-03-29: qty 4000, 1d supply, fill #0

## 2021-03-29 NOTE — Progress Notes (Signed)
Lynn Darby, MD [address]  [place]  [place], [place] [address]  Main: [phone]  Fax: [phone]    Gastroenterology Consultation  Referring Provider:     Birdie Sons, MD Primary Care Physician:  Lynn Sons, MD Primary Gastroenterologist:  Dr. Cephas Holt Reason for Consultation:     Chronic right upper quadrant pain, chronic GERD        HPI:   Lynn Holt is a 45 y.o. female referred by Dr. Birdie Sons, MD  for consultation & management of chronic right upper quadrant pain and chronic GERD  Chronic right upper quadrant pain: Patient reports that she has been experiencing more or less constant right upper quadrant pain, radiating to the back and shoulder for more than a year, has been progressively worsening.  Patient reports that the pain is more or less constant, worse after a meal, previously it used to be after a fatty meal only.  Patient has been gradually losing weight.  Patient also reports abdominal bloating and soft stool, once a day.  She underwent abdominal ultrasound with no evidence of cholelithiasis.  She also underwent CT abdomen and pelvis with contrast which did not reveal any acute intra-abdominal pathology.  Patient reports that she had similar episode in 2015 as well as in 2018, underwent work-up to evaluate for gallbladder etiology including HIDA scan and was unremarkable.  The pain is limiting her from exercising  Chronic GERD: Patient reports that she has been experiencing worsening of heartburn, worse after eating and laying down associated with regurgitation.  She has tried over-the-counter omeprazole 10 mg which does not provide much relief.  She has almost avoided all the dietary triggers.  However, her heartburn is persistent.  She denies dysphagia  Patient works in the radiology department at [employer]  Patient does not smoke or drink alcohol  NSAIDs: None  Antiplts/Anticoagulants/Anti thrombotics: None  GI  Procedures: None She denies family history of celiac disease, GI malignancy, IBD  Past Medical History:  Diagnosis Date   Endometriosis    Family history of adverse reaction to anesthesia    nausea   Headache    PONV (postoperative nausea and vomiting)     Past Surgical History:  Procedure Laterality Date   APPENDECTOMY     labrium repair     OVARIAN CYST REMOVAL     SHOULDER SURGERY     TONSILLECTOMY     ULNAR NERVE TRANSPOSITION Right 08/27/2017   Procedure: RIGHT ULNAR NERVE TRANSPOSITION AT THE ELBOW;  Surgeon: Milly Jakob, MD;  Location: [facility];  Service: Orthopedics;  Laterality: Right;    Current Outpatient Medications:    medroxyPROGESTERone Acetate 150 MG/ML SUSY, INJECT 1 ML AS DIRECTED EVERY 3 MONTHS., Disp: 1 mL, Rfl: 3   Multiple Vitamin (MULTIVITAMIN) capsule, Take 1 capsule by mouth daily., Disp: , Rfl:    ondansetron (ZOFRAN-ODT) 4 MG disintegrating tablet, Take 1 tablet (4 mg total) by mouth every 8 (eight) hours as needed for nausea or vomiting., Disp: 20 tablet, Rfl: 0   polyethylene glycol-electrolytes (NULYTELY) 420 g solution, Take 4,000 mLs by mouth once for 1 dose., Disp: 4000 mL, Rfl: 0   omeprazole (PRILOSEC) 40 MG capsule, Take 1 capsule (40 mg total) by mouth daily before breakfast., Disp: 30 capsule, Rfl: 2    Family History  Problem Relation Age of Onset   Fibromyalgia Mother    Rheum arthritis Father    Heart attack  Father    Breast cancer Maternal Grandmother    Colon cancer Maternal Grandmother    Heart attack Paternal Grandfather    Esophageal cancer Paternal Grandfather    Breast cancer Maternal Aunt    Neuropathy Neg Hx      Social History   Tobacco Use   Smoking status: Never   Smokeless tobacco: Never  Vaping Use   Vaping Use: Never used  Substance Use Topics   Alcohol use: Yes    Comment: occas   Drug use: No    Allergies as of 03/29/2021 - Review Complete 03/29/2021  Allergen Reaction Noted    Morphine and related  09/01/2008   Mucinex fast-max Nausea And Vomiting 09/20/2020   Ultram  [tramadol]      Review of Systems:    All systems reviewed and negative except where noted in HPI.   Physical Exam:  BP (!) 133/92 (BP Location: Left Arm, Patient Position: Sitting, Cuff Size: Normal)   Pulse 86   Temp (!) 97.4 F (36.3 C) (Oral)   Ht 5\' 2"  (1.575 m)   Wt 130 lb 12.8 oz (59.3 kg)   BMI 23.92 kg/m  No LMP recorded. Patient has had an injection.  General:   Alert,  Well-developed, well-nourished, pleasant and cooperative in NAD Head:  Normocephalic and atraumatic. Eyes:  Sclera clear, no icterus.   Conjunctiva pink. Ears:  Normal auditory acuity. Nose:  No deformity, discharge, or lesions. Mouth:  No deformity or lesions,oropharynx pink & moist. Neck:  Supple; no masses or thyromegaly. Lungs:  Respirations even and unlabored.  Clear throughout to auscultation.   No wheezes, crackles, or rhonchi. No acute distress. Heart:  Regular rate and rhythm; no murmurs, clicks, rubs, or gallops. Abdomen:  Normal bowel sounds. Soft, right upper quadrant tenderness and non-distended without masses, hepatosplenomegaly or hernias noted.  No guarding or rebound tenderness.   Rectal: Not performed Msk:  Symmetrical without gross deformities. Good, equal movement & strength bilaterally. Pulses:  Normal pulses noted. Extremities:  No clubbing or edema.  No cyanosis. Neurologic:  Alert and oriented x3;  grossly normal neurologically. Skin:  Intact without significant lesions or rashes. No jaundice. Psych:  Alert and cooperative. Normal mood and affect.  Imaging Studies: Reviewed  Assessment and Plan:   Lynn Holt is a 45 y.o. pleasant Caucasian female with history of chronic GERD, chronic right upper quadrant pain, associated with abdominal bloating, loose stools  Chronic GERD Start omeprazole 40 mg daily before breakfast Continue antireflux lifestyle Recommend EGD for further  evaluation  Chronic right upper quadrant pain associated with abdominal bloating, loose stools Recommend H. pylori breath test Recommend EGD for further evaluation, biopsies to rule out celiac disease If her symptoms are persistent and above work-up negative, recommend stool studies to rule out infection.  If this is negative, recommend referral to general surgery to evaluate for biliary dyskinesia and cholecystectomy  Colon cancer screening Recommend screening colonoscopy  I have discussed alternative options, risks & benefits,  which include, but are not limited to, bleeding, infection, perforation,respiratory complication & drug reaction.  The patient agrees with this plan & written consent will be obtained.    Follow up based on above work-up   Lynn Darby, MD

## 2021-03-31 LAB — H. PYLORI BREATH TEST: H pylori Breath Test: NEGATIVE

## 2021-04-25 ENCOUNTER — Encounter: Payer: Self-pay | Admitting: Gastroenterology

## 2021-04-26 ENCOUNTER — Ambulatory Visit
Admission: RE | Admit: 2021-04-26 | Discharge: 2021-04-26 | Disposition: A | Discharge status: Home / Self Care | Payer: 59 | Source: Ambulatory Visit | Attending: Gastroenterology | Admitting: Gastroenterology

## 2021-04-26 ENCOUNTER — Encounter
Admission: RE | Disposition: A | Discharge status: Home / Self Care | Payer: Self-pay | Source: Ambulatory Visit | Attending: Gastroenterology

## 2021-04-26 ENCOUNTER — Encounter: Payer: Self-pay | Admitting: Gastroenterology

## 2021-04-26 ENCOUNTER — Ambulatory Visit: Payer: 59 | Admitting: Certified Registered"

## 2021-04-26 DIAGNOSIS — D124 Benign neoplasm of descending colon: Secondary | ICD-10-CM | POA: Diagnosis not present

## 2021-04-26 DIAGNOSIS — R1013 Epigastric pain: Secondary | ICD-10-CM | POA: Diagnosis not present

## 2021-04-26 DIAGNOSIS — K635 Polyp of colon: Secondary | ICD-10-CM | POA: Diagnosis not present

## 2021-04-26 DIAGNOSIS — Z860101 Personal history of adenomatous and serrated colon polyps: Secondary | ICD-10-CM

## 2021-04-26 DIAGNOSIS — E039 Hypothyroidism, unspecified: Secondary | ICD-10-CM | POA: Diagnosis not present

## 2021-04-26 DIAGNOSIS — R1011 Right upper quadrant pain: Secondary | ICD-10-CM | POA: Diagnosis not present

## 2021-04-26 DIAGNOSIS — Z1211 Encounter for screening for malignant neoplasm of colon: Secondary | ICD-10-CM

## 2021-04-26 DIAGNOSIS — Z86010 Personal history of colonic polyps: Secondary | ICD-10-CM

## 2021-04-26 DIAGNOSIS — G8929 Other chronic pain: Secondary | ICD-10-CM

## 2021-04-26 HISTORY — DX: Hypothyroidism, unspecified: E03.9

## 2021-04-26 HISTORY — PX: ESOPHAGOGASTRODUODENOSCOPY: SHX5428

## 2021-04-26 HISTORY — PX: COLONOSCOPY WITH PROPOFOL: SHX5780

## 2021-04-26 LAB — POCT PREGNANCY, URINE: Preg Test, Ur: NEGATIVE

## 2021-04-26 SURGERY — COLONOSCOPY WITH PROPOFOL
Anesthesia: General

## 2021-04-26 MED ORDER — PROPOFOL 10 MG/ML IV BOLUS
INTRAVENOUS | Status: AC
  Filled 2021-04-26: qty 20

## 2021-04-26 MED ORDER — PROPOFOL 500 MG/50ML IV EMUL
INTRAVENOUS | Status: DC | PRN
  Administered 2021-04-26: 125 ug/kg/min via INTRAVENOUS

## 2021-04-26 MED ORDER — PROPOFOL 10 MG/ML IV BOLUS
INTRAVENOUS | Status: DC | PRN
  Administered 2021-04-26: 150 mg via INTRAVENOUS

## 2021-04-26 MED ORDER — LIDOCAINE HCL (CARDIAC) PF 100 MG/5ML IV SOSY
PREFILLED_SYRINGE | INTRAVENOUS | Status: DC | PRN
  Administered 2021-04-26: 100 mg via INTRAVENOUS

## 2021-04-26 MED ORDER — PROPOFOL 500 MG/50ML IV EMUL
INTRAVENOUS | Status: AC
  Filled 2021-04-26: qty 50

## 2021-04-26 MED ORDER — GLYCOPYRROLATE 0.2 MG/ML IJ SOLN
INTRAMUSCULAR | Status: DC | PRN
  Administered 2021-04-26: .2 mg via INTRAVENOUS

## 2021-04-26 MED ORDER — SODIUM CHLORIDE 0.9 % IV SOLN
INTRAVENOUS | Status: DC
  Administered 2021-04-26: 1000 mL via INTRAVENOUS

## 2021-04-26 NOTE — Op Note (Signed)
[facility] Gastroenterology Patient Name: Lynn Holt Procedure Date: 04/26/2021 7:16 AM MRN: [id number] Account #: [id number] Date of Birth: 1975-11-07 Admit Type: Outpatient Age: 45 Room: [facility] ENDO ROOM 3 Gender: Female Note Status: Finalized Instrument Name: Jasper Riling [id number] Procedure:             Colonoscopy Indications:           Screening for colorectal malignant neoplasm, This is                         the patient's first colonoscopy Providers:             Lin Landsman MD, MD Referring MD:          Kirstie Peri. Caryn Section, MD (Referring MD) Medicines:             General Anesthesia Complications:         No immediate complications. Estimated blood loss: None. Procedure:             Pre-Anesthesia Assessment:                        - Prior to the procedure, a History and Physical was                         performed, and patient medications and allergies were                         reviewed. The patient is competent. The risks and                         benefits of the procedure and the sedation options and                         risks were discussed with the patient. All questions                         were answered and informed consent was obtained.                         Patient identification and proposed procedure were                         verified by the physician, the nurse, the                         anesthesiologist, the anesthetist and the technician                         in the pre-procedure area in the procedure room in the                         endoscopy suite. Mental Status Examination: alert and                         oriented. Airway Examination: normal oropharyngeal                         airway and neck mobility. Respiratory Examination:  clear to auscultation. CV Examination: normal.                         Prophylactic Antibiotics: The patient does not require                          prophylactic antibiotics. Prior Anticoagulants: The                         patient has taken no previous anticoagulant or                         antiplatelet agents. ASA Grade Assessment: II - A                         patient with mild systemic disease. After reviewing                         the risks and benefits, the patient was deemed in                         satisfactory condition to undergo the procedure. The                         anesthesia plan was to use general anesthesia.                         Immediately prior to administration of medications,                         the patient was re-assessed for adequacy to receive                         sedatives. The heart rate, respiratory rate, oxygen                         saturations, blood pressure, adequacy of pulmonary                         ventilation, and response to care were monitored                         throughout the procedure. The physical status of the                         patient was re-assessed after the procedure.                        After obtaining informed consent, the colonoscope was                         passed under direct vision. Throughout the procedure,                         the patient's blood pressure, pulse, and oxygen                         saturations were monitored continuously. The  Colonoscope was introduced through the anus and                         advanced to the the terminal ileum, with                         identification of the appendiceal orifice and IC                         valve. The colonoscopy was performed without                         difficulty. The patient tolerated the procedure well.                         The quality of the bowel preparation was evaluated                         using the BBPS [facility] Bowel Preparation Scale) with                         scores of: Right Colon = 3, Transverse Colon = 3 and                          Left Colon = 3 (entire mucosa seen well with no                         residual staining, small fragments of stool or opaque                         liquid). The total BBPS score equals 9. Findings:      The perianal and digital rectal examinations were normal. Pertinent       negatives include normal sphincter tone and no palpable rectal lesions.      The terminal ileum appeared normal.      A 5 mm polyp was found in the descending colon. The polyp was sessile.       The polyp was removed with a cold snare. Resection and retrieval were       complete.      The retroflexed view of the distal rectum and anal verge was normal and       showed no anal or rectal abnormalities. Impression:            - The examined portion of the ileum was normal.                        - One 5 mm polyp in the descending colon, removed with                         a cold snare. Resected and retrieved.                        - The distal rectum and anal verge are normal on                         retroflexion view. Recommendation:        - Discharge patient to home (with  escort).                        - Resume previous diet today.                        - Continue present medications.                        - Await pathology results.                        - Repeat colonoscopy in 7-10 years for surveillance                         based on pathology results. Procedure Code(s):     --- Professional ---                        [phone], Colonoscopy, flexible; with removal of                         tumor(s), polyp(s), or other lesion(s) by snare                         technique Diagnosis Code(s):     --- Professional ---                        Z12.11, Encounter for screening for malignant neoplasm                         of colon                        K63.5, Polyp of colon CPT copyright 2019 American Medical Association. All rights reserved. The codes documented in this report are preliminary and upon coder  review may  be revised to meet current compliance requirements. Dr. Ulyess Mort Lin Landsman MD, MD 04/26/2021 8:15:13 AM This report has been signed electronically. Number of Addenda: 0 Note Initiated On: 04/26/2021 7:16 AM Scope Withdrawal Time: 0 hours 10 minutes 40 seconds  Total Procedure Duration: 0 hours 12 minutes 52 seconds  Estimated Blood Loss:  Estimated blood loss: none.      [facility]

## 2021-04-26 NOTE — H&P (Signed)
Lynn Darby, MD [address]  [place]  [place],  [address]  Main: [phone]  Fax: [phone] Pager: [phone]  Primary Care Physician:  Birdie Sons, MD Primary Gastroenterologist:  Dr. Cephas Holt  Pre-Procedure History & Physical: HPI:  Lynn Holt is a 45 y.o. female is here for an endoscopy and colonoscopy.   Past Medical History:  Diagnosis Date   Endometriosis    Family history of adverse reaction to anesthesia    nausea   Fatigue 03/28/2021   Feeling bilious 03/04/2009   Hay fever 09/05/2008   Headache    Hypothyroidism    PONV (postoperative nausea and vomiting)    Tachycardia 03/28/2021    Past Surgical History:  Procedure Laterality Date   APPENDECTOMY     labrium repair     OVARIAN CYST REMOVAL     SHOULDER SURGERY     TONSILLECTOMY     ULNAR NERVE TRANSPOSITION Right 08/27/2017   Procedure: RIGHT ULNAR NERVE TRANSPOSITION AT THE ELBOW;  Surgeon: Milly Jakob, MD;  Location: [facility];  Service: Orthopedics;  Laterality: Right;    Prior to Admission medications   Medication Sig Start Date End Date Taking? Authorizing Provider  medroxyPROGESTERone Acetate 150 MG/ML SUSY INJECT 1 ML AS DIRECTED EVERY 3 MONTHS. 07/12/20 07/12/21 Yes Gae Dry, MD  Multiple Vitamin (MULTIVITAMIN) capsule Take 1 capsule by mouth daily.   Yes [provider]  omeprazole (PRILOSEC) 40 MG capsule Take 1 capsule (40 mg total) by mouth daily before breakfast. 03/29/21 04/29/21 Yes Natoria Archibald, Tally Due, MD  ondansetron (ZOFRAN-ODT) 4 MG disintegrating tablet Take 1 tablet (4 mg total) by mouth every 8 (eight) hours as needed for nausea or vomiting. 09/28/20   Birdie Sons, MD    Allergies as of 03/29/2021 - Review Complete 03/29/2021  Allergen Reaction Noted   Morphine and related  09/01/2008   Mucinex fast-max Nausea And Vomiting 09/20/2020   Ultram  [tramadol]      Family History  Problem Relation Age of Onset    Fibromyalgia Mother    Rheum arthritis Father    Heart attack Father    Breast cancer Maternal Grandmother    Colon cancer Maternal Grandmother    Heart attack Paternal Grandfather    Esophageal cancer Paternal Grandfather    Breast cancer Maternal Aunt    Neuropathy Neg Hx     Social History   Socioeconomic History   Marital status: Married    Spouse name: Not on file   Number of children: 0   Years of education: Masters    Highest education level: Not on file  Occupational History   Not on file  Tobacco Use   Smoking status: Never   Smokeless tobacco: Never  Vaping Use   Vaping Use: Never used  Substance and Sexual Activity   Alcohol use: Yes    Comment: occas   Drug use: No   Sexual activity: Yes    Birth control/protection: None  Other Topics Concern   Not on file  Social History Narrative   Drinks 1 caffeine drink a day    Social Determinants of Health   Financial Resource Strain: Not on file  Food Insecurity: Not on file  Transportation Needs: Not on file  Physical Activity: Not on file  Stress: Not on file  Social Connections: Not on file  Intimate Partner Violence: Not on file    Review of Systems: See HPI, otherwise negative ROS  Physical Exam: BP (!) 137/103   Pulse 87   Temp (!) 96.4 F (35.8 C) (Temporal)   Resp 16   Ht 5\' 2"  (1.575 m)   Wt 75.9 kg   SpO2 100%   BMI 30.60 kg/m  General:   Alert,  pleasant and cooperative in NAD Head:  Normocephalic and atraumatic. Neck:  Supple; no masses or thyromegaly. Lungs:  Clear throughout to auscultation.    Heart:  Regular rate and rhythm. Abdomen:  Soft, nontender and nondistended. Normal bowel sounds, without guarding, and without rebound.   Neurologic:  Alert and  oriented x4;  grossly normal neurologically.  Impression/Plan: Lynn Holt is here for an endoscopy and colonoscopy to be performed for chronic RUQ pain, colon cancer screening  Risks, benefits, limitations, and  alternatives regarding  endoscopy and colonoscopy have been reviewed with the patient.  Questions have been answered.  All parties agreeable.   Sherri Sear, MD  04/26/2021, 7:42 AM

## 2021-04-26 NOTE — Op Note (Signed)
[facility] Gastroenterology Patient Name: Lynn Holt Procedure Date: 04/26/2021 7:16 AM MRN: [id number] Account #: [id number] Date of Birth: Mar 23, 1976 Admit Type: Outpatient Age: 45 Room: [facility] ENDO ROOM 3 Gender: Female Note Status: Finalized Instrument Name: Upper Endoscope [id number] Procedure:             Upper GI endoscopy Indications:           Abdominal pain in the right upper quadrant, Dyspepsia,                         Heartburn Providers:             Lin Landsman MD, MD Referring MD:          Kirstie Peri. Caryn Section, MD (Referring MD) Medicines:             General Anesthesia Complications:         No immediate complications. Estimated blood loss: None. Procedure:             Pre-Anesthesia Assessment:                        - Prior to the procedure, a History and Physical was                         performed, and patient medications and allergies were                         reviewed. The patient is competent. The risks and                         benefits of the procedure and the sedation options and                         risks were discussed with the patient. All questions                         were answered and informed consent was obtained.                         Patient identification and proposed procedure were                         verified by the physician, the nurse, the                         anesthesiologist, the anesthetist and the technician                         in the pre-procedure area in the procedure room in the                         endoscopy suite. Mental Status Examination: alert and                         oriented. Airway Examination: normal oropharyngeal                         airway and neck mobility. Respiratory Examination:  clear to auscultation. CV Examination: normal.                         Prophylactic Antibiotics: The patient does not require                         prophylactic  antibiotics. Prior Anticoagulants: The                         patient has taken no previous anticoagulant or                         antiplatelet agents. ASA Grade Assessment: II - A                         patient with mild systemic disease. After reviewing                         the risks and benefits, the patient was deemed in                         satisfactory condition to undergo the procedure. The                         anesthesia plan was to use general anesthesia.                         Immediately prior to administration of medications,                         the patient was re-assessed for adequacy to receive                         sedatives. The heart rate, respiratory rate, oxygen                         saturations, blood pressure, adequacy of pulmonary                         ventilation, and response to care were monitored                         throughout the procedure. The physical status of the                         patient was re-assessed after the procedure.                        After obtaining informed consent, the endoscope was                         passed under direct vision. Throughout the procedure,                         the patient's blood pressure, pulse, and oxygen                         saturations were monitored continuously. The Endoscope  was introduced through the mouth, and advanced to the                         second part of duodenum. The upper GI endoscopy was                         accomplished without difficulty. The patient tolerated                         the procedure well. Findings:      The duodenal bulb and second portion of the duodenum were normal.       Biopsies were taken with a cold forceps for histology.      The entire examined stomach was normal. Biopsies were taken with a cold       forceps for Helicobacter pylori testing.      The cardia and gastric fundus were normal on retroflexion.       Esophagogastric landmarks were identified: the gastroesophageal junction       was found at 34 cm from the incisors.      The gastroesophageal junction and examined esophagus were normal. Impression:            - Normal duodenal bulb and second portion of the                         duodenum. Biopsied.                        - Normal stomach. Biopsied.                        - Esophagogastric landmarks identified.                        - Normal gastroesophageal junction and esophagus. Recommendation:        - Await pathology results.                        - Follow an antireflux regimen.                        - Proceed with colonoscopy as scheduled                        See colonoscopy report Procedure Code(s):     --- Professional ---                        [phone], Esophagogastroduodenoscopy, flexible,                         transoral; with biopsy, single or multiple Diagnosis Code(s):     --- Professional ---                        R10.11, Right upper quadrant pain                        R12, Heartburn                        R10.13, Epigastric pain CPT copyright 2019 American Medical Association. All rights reserved.  The codes documented in this report are preliminary and upon coder review may  be revised to meet current compliance requirements. Dr. Ulyess Mort Lin Landsman MD, MD 04/26/2021 7:58:42 AM This report has been signed electronically. Number of Addenda: 0 Note Initiated On: 04/26/2021 7:16 AM Estimated Blood Loss:  Estimated blood loss: none.      [facility]

## 2021-04-26 NOTE — Transfer of Care (Signed)
Immediate Anesthesia Transfer of Care Note  Patient: Lynn Holt  Procedure(s) Performed: COLONOSCOPY WITH PROPOFOL ESOPHAGOGASTRODUODENOSCOPY (EGD)  Patient Location: PACU  Anesthesia Type:General  Level of Consciousness: awake  Airway & Oxygen Therapy: Patient Spontanous Breathing  Post-op Assessment: Report given to RN  Post vital signs: stable  Last Vitals:  Vitals Value Taken Time  BP    Temp    Pulse    Resp    SpO2      Last Pain:  Vitals:   04/26/21 0715  TempSrc: Temporal  PainSc: 0-No pain         Complications: No notable events documented.

## 2021-04-26 NOTE — Anesthesia Preprocedure Evaluation (Signed)
Anesthesia Evaluation  Patient identified by MRN, date of birth, ID band Patient awake    Reviewed: Allergy & Precautions, H&P , NPO status , Patient's Chart, lab work & pertinent test results, reviewed documented beta blocker date and time   History of Anesthesia Complications (+) PONV, Family history of anesthesia reaction and history of anesthetic complications  Airway Mallampati: II   Neck ROM: full    Dental  (+) Poor Dentition   Pulmonary neg pulmonary ROS,    Pulmonary exam normal        Cardiovascular Exercise Tolerance: Good negative cardio ROS Normal cardiovascular exam Rhythm:regular Rate:Normal     Neuro/Psych  Headaches, negative psych ROS   GI/Hepatic negative GI ROS, Neg liver ROS,   Endo/Other  Hypothyroidism   Renal/GU negative Renal ROS  negative genitourinary   Musculoskeletal   Abdominal   Peds  Hematology negative hematology ROS (+)   Anesthesia Other Findings Past Medical History: No date: Endometriosis No date: Family history of adverse reaction to anesthesia     Comment:  nausea 03/28/2021: Fatigue 03/04/2009: Feeling bilious 09/05/2008: Hay fever No date: Headache No date: Hypothyroidism No date: PONV (postoperative nausea and vomiting) 03/28/2021: Tachycardia Past Surgical History: No date: APPENDECTOMY No date: labrium repair No date: OVARIAN CYST REMOVAL No date: SHOULDER SURGERY No date: TONSILLECTOMY 08/27/2017: ULNAR NERVE TRANSPOSITION; Right     Comment:  Procedure: RIGHT ULNAR NERVE TRANSPOSITION AT THE ELBOW;              Surgeon: Milly Jakob, MD;  Location: [facility];  Service: Orthopedics;  Laterality:               Right; BMI    Body Mass Index: 30.60 kg/m     Reproductive/Obstetrics negative OB ROS                             Anesthesia Physical Anesthesia Plan  ASA: 3  Anesthesia Plan: General    Post-op Pain Management:    Induction:   PONV Risk Score and Plan:   Airway Management Planned:   Additional Equipment:   Intra-op Plan:   Post-operative Plan:   Informed Consent: I have reviewed the patients History and Physical, chart, labs and discussed the procedure including the risks, benefits and alternatives for the proposed anesthesia with the patient or authorized representative who has indicated his/her understanding and acceptance.     Dental Advisory Given  Plan Discussed with: CRNA  Anesthesia Plan Comments:         Anesthesia Quick Evaluation

## 2021-04-27 ENCOUNTER — Telehealth: Payer: Self-pay

## 2021-04-27 ENCOUNTER — Encounter: Payer: Self-pay | Admitting: Gastroenterology

## 2021-04-27 ENCOUNTER — Encounter: Payer: Self-pay | Admitting: Family Medicine

## 2021-04-27 DIAGNOSIS — G8929 Other chronic pain: Secondary | ICD-10-CM

## 2021-04-27 DIAGNOSIS — R1011 Right upper quadrant pain: Secondary | ICD-10-CM

## 2021-04-27 LAB — SURGICAL PATHOLOGY

## 2021-04-27 NOTE — Telephone Encounter (Signed)
Called patient to let her know I got her referral in to general surgery

## 2021-04-28 ENCOUNTER — Other Ambulatory Visit: Payer: Self-pay

## 2021-04-29 ENCOUNTER — Other Ambulatory Visit: Payer: Self-pay

## 2021-04-29 MED FILL — Medroxyprogesterone Acetate IM Susp Prefilled Syr 150 MG/ML: INTRAMUSCULAR | 90 days supply | Qty: 1 | Fill #2 | Status: AC

## 2021-05-02 ENCOUNTER — Other Ambulatory Visit: Payer: Self-pay

## 2021-05-02 NOTE — Anesthesia Postprocedure Evaluation (Signed)
Anesthesia Post Note  Patient: Sibyl Parr  Procedure(s) Performed: COLONOSCOPY WITH PROPOFOL ESOPHAGOGASTRODUODENOSCOPY (EGD)  Patient location during evaluation: PACU Anesthesia Type: General Level of consciousness: awake and alert Pain management: pain level controlled Vital Signs Assessment: post-procedure vital signs reviewed and stable Respiratory status: spontaneous breathing, nonlabored ventilation, respiratory function stable and patient connected to nasal cannula oxygen Cardiovascular status: blood pressure returned to baseline and stable Postop Assessment: no apparent nausea or vomiting Anesthetic complications: no   No notable events documented.   Last Vitals:  Vitals:   04/26/21 0818 04/26/21 0848  BP: 121/85 (!) 143/109  Pulse:    Resp:    Temp: (!) 36.1 C   SpO2:      Last Pain:  Vitals:   04/27/21 0751  TempSrc:   PainSc: 0-No pain                 Molli Barrows

## 2021-05-03 ENCOUNTER — Ambulatory Visit: Payer: Self-pay | Admitting: Surgery

## 2021-05-03 ENCOUNTER — Ambulatory Visit: Payer: 59 | Admitting: Surgery

## 2021-05-03 ENCOUNTER — Ambulatory Visit (INDEPENDENT_AMBULATORY_CARE_PROVIDER_SITE_OTHER): Payer: 59 | Admitting: Surgery

## 2021-05-03 ENCOUNTER — Other Ambulatory Visit: Payer: Self-pay

## 2021-05-03 ENCOUNTER — Encounter: Payer: Self-pay | Admitting: Surgery

## 2021-05-03 ENCOUNTER — Telehealth: Payer: Self-pay | Admitting: Surgery

## 2021-05-03 VITALS — BP 157/99 | HR 120 | Temp 98.4°F | Ht 62.0 in | Wt 174.0 lb

## 2021-05-03 DIAGNOSIS — K811 Chronic cholecystitis: Secondary | ICD-10-CM | POA: Diagnosis not present

## 2021-05-03 MED ORDER — ONDANSETRON HCL 4 MG PO TABS
4.0000 mg | ORAL_TABLET | Freq: Three times a day (TID) | ORAL | 1 refills | Status: DC | PRN
  Filled 2021-05-03: qty 20, 7d supply, fill #0

## 2021-05-03 NOTE — Telephone Encounter (Signed)
Outgoing call is made, left message for patient to call.  Please inform of the following:   Pre-Admission date/time, COVID Testing date and Surgery date.  Surgery Date: 05/11/21 Preadmission Testing Date: 05/05/21 (phone 1p-5p) Covid Testing Date: Not needed.     Also patient will need to call at [phone], between 1-3:00pm the day before surgery, to find out what time to arrive for surgery.

## 2021-05-03 NOTE — Progress Notes (Signed)
Patient ID: Lynn Holt, female   DOB: [birth year], 45 y.o.   MRN: [id number]  Chief Complaint: Right upper quadrant pain  History of Present Illness Lynn Holt is a 45 y.o. female with a progressively increasing in severity right upper quadrant pain which radiates to the right scapula, exacerbated with attempts to eat.  Now provoked by nearly anything, including an inability to hold down water at times.  Recent GI work-up negative.  Prior history of HIDA scan with inability to perform EF secondary to intolerance of Ensure.  Last ultrasound negative for the presence of stones. She reports she has to sleep on her right side due to exacerbating the pain sleeping on her left.  She reports she is taken 1000 mg of ibuprofen at times just to get sleep.  Zofran has been helpful, and she has run out.  Past Medical History Past Medical History:  Diagnosis Date   Endometriosis    Family history of adverse reaction to anesthesia    nausea   Fatigue 03/28/2021   Feeling bilious 03/04/2009   Hay fever 09/05/2008   Headache    Hypothyroidism    PONV (postoperative nausea and vomiting)    Tachycardia 03/28/2021      Past Surgical History:  Procedure Laterality Date   APPENDECTOMY     COLONOSCOPY WITH PROPOFOL N/A 04/26/2021   Procedure: COLONOSCOPY WITH PROPOFOL;  Surgeon: Lin Landsman, MD;  Location: [facility] ENDOSCOPY;  Service: Gastroenterology;  Laterality: N/A;   ESOPHAGOGASTRODUODENOSCOPY N/A 04/26/2021   Procedure: ESOPHAGOGASTRODUODENOSCOPY (EGD);  Surgeon: Lin Landsman, MD;  Location: [facility] ENDOSCOPY;  Service: Gastroenterology;  Laterality: N/A;   labrium repair     OVARIAN CYST REMOVAL     SHOULDER SURGERY     TONSILLECTOMY     ULNAR NERVE TRANSPOSITION Right 08/27/2017   Procedure: RIGHT ULNAR NERVE TRANSPOSITION AT THE ELBOW;  Surgeon: Milly Jakob, MD;  Location: [facility];  Service: Orthopedics;  Laterality: Right;    Allergies  Allergen  Reactions   Morphine And Related     Other reaction(s): Fever, Skin Rashes   Mucinex Fast-Max Nausea And Vomiting   Ultram  [Tramadol]     Current Outpatient Medications  Medication Sig Dispense Refill   ondansetron (ZOFRAN) 4 MG tablet Take 1 tablet (4 mg total) by mouth every 8 (eight) hours as needed for nausea or vomiting. 20 tablet 1   FLUZONE QUADRIVALENT 0.5 ML injection      medroxyPROGESTERone Acetate 150 MG/ML SUSY INJECT 1 ML AS DIRECTED EVERY 3 MONTHS. 1 mL 3   Multiple Vitamin (MULTIVITAMIN) capsule Take 1 capsule by mouth daily.     omeprazole (PRILOSEC) 40 MG capsule Take 1 capsule (40 mg total) by mouth daily before breakfast. 30 capsule 2   ondansetron (ZOFRAN-ODT) 4 MG disintegrating tablet Take 1 tablet (4 mg total) by mouth every 8 (eight) hours as needed for nausea or vomiting. 20 tablet 0   No current facility-administered medications for this visit.    Family History Family History  Problem Relation Age of Onset   Fibromyalgia Mother    Rheum arthritis Father    Heart attack Father    Breast cancer Maternal Grandmother    Colon cancer Maternal Grandmother    Heart attack Paternal Grandfather    Esophageal cancer Paternal Grandfather    Breast cancer Maternal Aunt    Neuropathy Neg Hx       Social History Social History   Tobacco Use   Smoking  status: Never   Smokeless tobacco: Never  Vaping Use   Vaping Use: Never used  Substance Use Topics   Alcohol use: Yes    Comment: occas   Drug use: No        Review of Systems  Constitutional:  Positive for malaise/fatigue.  HENT: Negative.    Eyes: Negative.   Respiratory: Negative.    Cardiovascular: Negative.   Gastrointestinal:  Positive for abdominal pain, diarrhea, heartburn, nausea and vomiting. Negative for blood in stool, constipation and melena.  Genitourinary: Negative.   Skin:  Positive for itching.  Neurological:  Positive for headaches.  Psychiatric/Behavioral: Negative.        Physical Exam Blood pressure (!) 157/99, pulse (!) 120, temperature 98.4 F (36.9 C), temperature source Oral, height 5\' 2"  (1.575 m), weight 174 lb (78.9 kg), SpO2 97 %. Last Weight  Most recent update: 05/03/2021 10:09 AM    Weight  78.9 kg (174 lb)             CONSTITUTIONAL: Well developed, and nourished, appropriately responsive and aware without distress.   EYES: Sclera non-icteric.   EARS, NOSE, MOUTH AND THROAT: Mask worn.    Hearing is intact to voice.  NECK: Trachea is midline, and there is no jugular venous distension.  LYMPH NODES:  Lymph nodes in the neck are not enlarged. RESPIRATORY:  Lungs are clear, and breath sounds are equal bilaterally. Normal respiratory effort without pathologic use of accessory muscles. CARDIOVASCULAR: Heart is regular in rate and rhythm. GI: The abdomen is soft, nontender, and nondistended. There were no palpable masses. I did not appreciate hepatosplenomegaly.  Well-healed right lower quadrant scar is noted. MUSCULOSKELETAL:  Symmetrical muscle tone appreciated in all four extremities.    SKIN: Skin turgor is normal. No pathologic skin lesions appreciated.  NEUROLOGIC:  Motor and sensation appear grossly normal.  Cranial nerves are grossly without defect. PSYCH:  Alert and oriented to person, place and time. Affect is appropriate for situation.  Data Reviewed I have personally reviewed what is currently available of the patient's imaging, recent labs and medical records.   Labs:  CBC Latest Ref Rng & Units 09/20/2020 07/12/2016 01/27/2014  WBC 3.4 - 10.8 x10E3/uL 11.0(H) 6.7 7.7  Hemoglobin 11.1 - 15.9 g/dL 13.7 13.3 13.1  Hematocrit 34.0 - 46.6 % 40.7 39.8 40.5  Platelets 150 - 450 x10E3/uL 222 236 210   CMP Latest Ref Rng & Units 09/20/2020 07/12/2016 01/27/2014  Glucose 65 - 99 mg/dL 85 88 -  BUN 6 - 24 mg/dL 9 9 -  Creatinine 0.57 - 1.00 mg/dL 0.85 0.84 -  Sodium 134 - 144 mmol/L 139 142 -  Potassium 3.5 - 5.2 mmol/L 4.4 5.0 -  Chloride  96 - 106 mmol/L 103 103 -  CO2 20 - 29 mmol/L 19(L) 24 -  Calcium 8.7 - 10.2 mg/dL 9.4 9.5 -  Total Protein 6.0 - 8.5 g/dL 7.0 7.1 7.6  Total Bilirubin 0.0 - 1.2 mg/dL 0.5 0.4 0.6  Alkaline Phos 44 - 121 IU/L 61 59 70  AST 0 - 40 IU/L 16 13 27   ALT 0 - 32 IU/L 14 9 22       Imaging: Radiology review:  2012's HIDA scan showed ejection fraction of 57%, no notation was present regarding any symptoms that were provoked due to the CCK administration.  CLINICAL DATA:  Four months of right upper quadrant abdominal pain   EXAM: ULTRASOUND ABDOMEN LIMITED RIGHT UPPER QUADRANT   COMPARISON:  January 27, 2014.   FINDINGS: Gallbladder:   No gallstones or wall thickening visualized. No sonographic Murphy sign noted by sonographer.   Common bile duct:   Diameter: 2 mm   Liver:   No solid focal lesion identified. 1 cm cyst in the right lobe of the liver. Within normal limits in parenchymal echogenicity. Portal vein is patent on color Doppler imaging with normal direction of blood flow towards the liver.   Other: None.   IMPRESSION: Unremarkable right upper quadrant ultrasound.     Electronically Signed   By: Dahlia Bailiff MD   On: 09/22/2020 17:17  Within last 24 hrs: No results found.  Assessment    Clinically consistent with chronic cholecystitis. Patient Active Problem List   Diagnosis Date Noted   History of adenomatous polyp of colon    Migraine 03/28/2021   Abdominal pain, chronic, right upper quadrant 03/28/2021   Last menstrual period (LMP) < 10 days ago 03/28/2021   Vitamin D deficiency 09/20/2020   Esophagitis, reflux 04/02/2009   Family history of cardiovascular disease 09/01/2008    Plan    Robotic cholecystectomy.  We discussed the options of repeat HIDA scan, utilization of CCK stimulation versus another attempt at ensure.  I believe this patient has suffered sufficiently enough, ruling out all the common sources of this type of right upper  quadrant pain. She desires to proceed with surgery.  She is aware that surgery will only alleviate the symptoms directly related to the gallbladder itself, and sometimes the only way to ensure resolution is over time once the gallbladder has been eliminated. This was discussed thoroughly.  Optimal plan is for robotic cholecystectomy.  Risks and benefits have been discussed with the patient which include but are not limited to anesthesia, bleeding, infection, biliary ductal injury or stenosis, other associated unanticipated injuries affiliated with laparoscopic surgery.  I believe there is the desire to proceed, interpreter utilized as needed.  Questions elicited and answered to satisfaction.  No guarantees ever expressed or implied.   Face-to-face time spent with the patient and accompanying care providers(if present) was 30 minutes, with more than 50% of the time spent counseling, educating, and coordinating care of the patient.    These notes generated with voice recognition software. I apologize for typographical errors.  Ronny Bacon M.D., FACS 05/03/2021, 10:44 AM

## 2021-05-03 NOTE — Patient Instructions (Addendum)
Zofran has been sent to your pharmacy.  Our surgery scheduler will call you within 24-48 hours to schedule your surgery. Please have the [facility] surgery sheet available when speaking with her.    Minimally Invasive Cholecystectomy Minimally invasive cholecystectomy is surgery to remove the gallbladder. The gallbladder is a pear-shaped organ that lies beneath the liver on the right side of the body. The gallbladder stores bile, which is a fluid that helps the body digest fats. Cholecystectomy is often done to treat inflammation (irritation and swelling) of the gallbladder (cholecystitis). This condition is usually caused by a buildup of gallstones (cholelithiasis) in the gallbladder or when the fluid in the gall bladder becomes stagnant because gallstones get stuck in the ducts (tubes) and block the flow of bile. This can result in inflammation and pain. In severe cases, emergency surgery may be required. This procedure is done through small incisions in the abdomen, instead of one large incision. It is also called laparoscopic surgery. A thin scope with a camera (laparoscope) is inserted through one incision. Then surgical instruments are inserted through the other incisions. In some cases, a minimally invasive surgery may need to be changed to a surgery that is done through a larger incision. This is called open surgery. Tell a health care provider about: Any allergies you have. All medicines you are taking, including vitamins, herbs, eye drops, creams, and over-the-counter medicines. Any problems you or family members have had with anesthetic medicines. Any bleeding problems you have. Any surgeries you have had. Any medical conditions you have. Whether you are pregnant or may be pregnant. What are the risks? Generally, this is a safe procedure. However, problems may occur, including: Infection. Bleeding. Allergic reactions to medicines. Damage to nearby structures or organs. A gallstone  remaining in the common bile duct. The common bile duct carries bile from the gallbladder to the small intestine. A bile leak from the liver or cystic duct after your gallbladder is removed. What happens before the procedure? When to stop eating and drinking Follow instructions from your health care provider about what you may eat and drink before your procedure. These may include: 8 hours before the procedure Stop eating most foods. Do not eat meat, fried foods, or fatty foods. Eat only light foods, such as toast or crackers. All liquids are okay except energy drinks and alcohol. 6 hours before the procedure Stop eating. Drink only clear liquids, such as water, clear fruit juice, black coffee, plain tea, and sports drinks. Do not drink energy drinks or alcohol. 2 hours before the procedure Stop drinking all liquids. You may be allowed to take medicines with small sips of water. If you do not follow your health care provider's instructions, your procedure may be delayed or canceled. Medicines Ask your health care provider about: Changing or stopping your regular medicines. This is especially important if you are taking diabetes medicines or blood thinners. Taking medicines such as aspirin and ibuprofen. These medicines can thin your blood. Do not take these medicines unless your health care provider tells you to take them. Taking over-the-counter medicines, vitamins, herbs, and supplements. General instructions If you will be going home right after the procedure, plan to have a responsible adult: Take you home from the hospital or clinic. You will not be allowed to drive. Care for you for the time you are told. Do not use any products that contain nicotine or tobacco for at least 4 weeks before the procedure. These products include cigarettes, chewing tobacco, and  vaping devices, such as e-cigarettes. If you need help quitting, ask your health care provider. Ask your health care  provider: How your surgery site will be marked. What steps will be taken to help prevent infection. These may include: Removing hair at the surgery site. Washing skin with a germ-killing soap. Taking antibiotic medicine. What happens during the procedure?  An IV will be inserted into one of your veins. You will be given one or both of the following: A medicine to help you relax (sedative). A medicine to make you fall asleep (general anesthetic). Your surgeon will make several small incisions in your abdomen. The laparoscope will be inserted through one of the small incisions. The camera on the laparoscope will send images to a monitor in the operating room. This lets your surgeon see inside your abdomen. A gas will be pumped into your abdomen. This will expand your abdomen to give the surgeon more room to perform the surgery. Other tools that are needed for the procedure will be inserted through the other incisions. The gallbladder will be removed through one of the incisions. Your common bile duct may be examined. If stones are found in the common bile duct, they may be removed. After your gallbladder has been removed, the incisions will be closed with stitches (sutures), staples, or skin glue. Your incisions will be covered with a bandage (dressing). The procedure may vary among health care providers and hospitals. What happens after the procedure? Your blood pressure, heart rate, breathing rate, and blood oxygen level will be monitored until you leave the hospital or clinic. You will be given medicines as needed to control your pain. You may have a drain placed in the incision. The drain will be removed a day or two after the procedure. Summary Minimally invasive cholecystectomy, also called laparoscopic cholecystectomy, is surgery to remove the gallbladder using small incisions. Tell your health care provider about all the medical conditions you have and all the medicines you are taking  for those conditions. Before the procedure, follow instructions about when to stop eating and drinking and changing or stopping medicines. Plan to have a responsible adult care for you for the time you are told after you leave the hospital or clinic. This information is not intended to replace advice given to you by your health care provider. Make sure you discuss any questions you have with your health care provider. Document Revised: 11/16/2020 Document Reviewed: 11/16/2020 Elsevier Patient Education  [place] If you have a gallbladder condition, you may have trouble digesting fats. Eating a low-fat diet can help reduce your symptoms, and may be helpful before and after having surgery to remove your gallbladder (cholecystectomy). Your health care provider may recommend that you work with a diet and nutrition specialist (dietitian) to help you reduce the amount of fat in your diet. What are tips for following this plan? General guidelines Limit your fat intake to less than 30% of your total daily calories. If you eat around 1,800 calories each day, this is less than 60 grams (g) of fat per day. Fat is an important part of a healthy diet. Eating a low-fat diet can make it hard to maintain a healthy body weight. Ask your dietitian how much fat, calories, and other nutrients you need each day. Eat small, frequent meals throughout the day instead of three large meals. Drink at least 8-10 cups of fluid a day. Drink enough fluid to keep your urine clear or pale yellow.  Limit alcohol intake to no more than 1 drink a day for nonpregnant women and 2 drinks a day for men. One drink equals 12 oz of beer, 5 oz of wine, or 1 oz of hard liquor. Reading food labels  Check Nutrition Facts on food labels for the amount of fat per serving. Choose foods with less than 3 grams of fat per serving. Shopping Choose nonfat and low-fat healthy foods. Look for the words "nonfat," "low  fat," or "fat free." Avoid buying processed or prepackaged foods. Cooking Cook using low-fat methods, such as baking, broiling, grilling, or boiling. Cook with small amounts of healthy fats, such as olive oil, grapeseed oil, canola oil, or sunflower oil. What foods are recommended? All fresh, frozen, or canned fruits and vegetables. Whole grains. Low-fat or non-fat (skim) milk and yogurt. Lean meat, skinless poultry, fish, eggs, and beans. Low-fat protein supplement powders or drinks. Spices and herbs. What foods are not recommended? High-fat foods. These include baked goods, fast food, fatty cuts of meat, ice cream, french toast, sweet rolls, pizza, cheese bread, foods covered with butter, creamy sauces, or cheese. Fried foods. These include french fries, tempura, battered fish, breaded chicken, fried breads, and sweets. Foods with strong odors. Foods that cause bloating and gas. Summary A low-fat diet can be helpful if you have a gallbladder condition, or before and after gallbladder surgery. Limit your fat intake to less than 30% of your total daily calories. This is about 60 g of fat if you eat 1,800 calories each day. Eat small, frequent meals throughout the day instead of three large meals. This information is not intended to replace advice given to you by your health care provider. Make sure you discuss any questions you have with your health care provider. Document Revised: 12/26/2019 Document Reviewed: 01/01/2020 Elsevier Patient Education  2022 [employer].

## 2021-05-03 NOTE — H&P (View-Only) (Signed)
Patient ID: Lynn Holt, female   DOB: Feb 16, 1976, 45 y.o.   MRN: [id number]  Chief Complaint: Right upper quadrant pain  History of Present Illness Lynn Holt is a 45 y.o. female with a progressively increasing in severity right upper quadrant pain which radiates to the right scapula, exacerbated with attempts to eat.  Now provoked by nearly anything, including an inability to hold down water at times.  Recent GI work-up negative.  Prior history of HIDA scan with inability to perform EF secondary to intolerance of Ensure.  Last ultrasound negative for the presence of stones. She reports she has to sleep on her right side due to exacerbating the pain sleeping on her left.  She reports she is taken 1000 mg of ibuprofen at times just to get sleep.  Zofran has been helpful, and she has run out.  Past Medical History Past Medical History:  Diagnosis Date   Endometriosis    Family history of adverse reaction to anesthesia    nausea   Fatigue 03/28/2021   Feeling bilious 03/04/2009   Hay fever 09/05/2008   Headache    Hypothyroidism    PONV (postoperative nausea and vomiting)    Tachycardia 03/28/2021      Past Surgical History:  Procedure Laterality Date   APPENDECTOMY     COLONOSCOPY WITH PROPOFOL N/A 04/26/2021   Procedure: COLONOSCOPY WITH PROPOFOL;  Surgeon: Lin Landsman, MD;  Location[facility] ENDOSCOPY;  Service: Gastroenterology;  Laterality: N/A;   ESOPHAGOGASTRODUODENOSCOPY N/A 04/26/2021   Procedure: ESOPHAGOGASTRODUODENOSCOPY (EGD);  Surgeon: Lin Landsman, MD;  Location: [facility] ENDOSCOPY;  Service: Gastroenterology;  Laterality: N/A;   labrium repair     OVARIAN CYST REMOVAL     SHOULDER SURGERY     TONSILLECTOMY     ULNAR NERVE TRANSPOSITION Right 08/27/2017   Procedure: RIGHT ULNAR NERVE TRANSPOSITION AT THE ELBOW;  Surgeon: Milly Jakob, MD;  Location: [place];  Service: Orthopedics;  Laterality: Right;    Allergies  Allergen  Reactions   Morphine And Related     Other reaction(s): Fever, Skin Rashes   Mucinex Fast-Max Nausea And Vomiting   Ultram  [Tramadol]     Current Outpatient Medications  Medication Sig Dispense Refill   ondansetron (ZOFRAN) 4 MG tablet Take 1 tablet (4 mg total) by mouth every 8 (eight) hours as needed for nausea or vomiting. 20 tablet 1   FLUZONE QUADRIVALENT 0.5 ML injection      medroxyPROGESTERone Acetate 150 MG/ML SUSY INJECT 1 ML AS DIRECTED EVERY 3 MONTHS. 1 mL 3   Multiple Vitamin (MULTIVITAMIN) capsule Take 1 capsule by mouth daily.     omeprazole (PRILOSEC) 40 MG capsule Take 1 capsule (40 mg total) by mouth daily before breakfast. 30 capsule 2   ondansetron (ZOFRAN-ODT) 4 MG disintegrating tablet Take 1 tablet (4 mg total) by mouth every 8 (eight) hours as needed for nausea or vomiting. 20 tablet 0   No current facility-administered medications for this visit.    Family History Family History  Problem Relation Age of Onset   Fibromyalgia Mother    Rheum arthritis Father    Heart attack Father    Breast cancer Maternal Grandmother    Colon cancer Maternal Grandmother    Heart attack Paternal Grandfather    Esophageal cancer Paternal Grandfather    Breast cancer Maternal Aunt    Neuropathy Neg Hx       Social History Social History   Tobacco Use   Smoking  status: Never   Smokeless tobacco: Never  Vaping Use   Vaping Use: Never used  Substance Use Topics   Alcohol use: Yes    Comment: occas   Drug use: No        Review of Systems  Constitutional:  Positive for malaise/fatigue.  HENT: Negative.    Eyes: Negative.   Respiratory: Negative.    Cardiovascular: Negative.   Gastrointestinal:  Positive for abdominal pain, diarrhea, heartburn, nausea and vomiting. Negative for blood in stool, constipation and melena.  Genitourinary: Negative.   Skin:  Positive for itching.  Neurological:  Positive for headaches.  Psychiatric/Behavioral: Negative.        Physical Exam Blood pressure (!) 157/99, pulse (!) 120, temperature 98.4 F (36.9 C), temperature source Oral, height 5\' 2"  (1.575 m), weight 174 lb (78.9 kg), SpO2 97 %. Last Weight  Most recent update: 05/03/2021 10:09 AM    Weight  78.9 kg (174 lb)             CONSTITUTIONAL: Well developed, and nourished, appropriately responsive and aware without distress.   EYES: Sclera non-icteric.   EARS, NOSE, MOUTH AND THROAT: Mask worn.    Hearing is intact to voice.  NECK: Trachea is midline, and there is no jugular venous distension.  LYMPH NODES:  Lymph nodes in the neck are not enlarged. RESPIRATORY:  Lungs are clear, and breath sounds are equal bilaterally. Normal respiratory effort without pathologic use of accessory muscles. CARDIOVASCULAR: Heart is regular in rate and rhythm. GI: The abdomen is soft, nontender, and nondistended. There were no palpable masses. I did not appreciate hepatosplenomegaly.  Well-healed right lower quadrant scar is noted. MUSCULOSKELETAL:  Symmetrical muscle tone appreciated in all four extremities.    SKIN: Skin turgor is normal. No pathologic skin lesions appreciated.  NEUROLOGIC:  Motor and sensation appear grossly normal.  Cranial nerves are grossly without defect. PSYCH:  Alert and oriented to person, place and time. Affect is appropriate for situation.  Data Reviewed I have personally reviewed what is currently available of the patient's imaging, recent labs and medical records.   Labs:  CBC Latest Ref Rng & Units 09/20/2020 07/12/2016 01/27/2014  WBC 3.4 - 10.8 x10E3/uL 11.0(H) 6.7 7.7  Hemoglobin 11.1 - 15.9 g/dL 13.7 13.3 13.1  Hematocrit 34.0 - 46.6 % 40.7 39.8 40.5  Platelets 150 - 450 x10E3/uL 222 236 210   CMP Latest Ref Rng & Units 09/20/2020 07/12/2016 01/27/2014  Glucose 65 - 99 mg/dL 85 88 -  BUN 6 - 24 mg/dL 9 9 -  Creatinine 0.57 - 1.00 mg/dL 0.85 0.84 -  Sodium 134 - 144 mmol/L 139 142 -  Potassium 3.5 - 5.2 mmol/L 4.4 5.0 -  Chloride  96 - 106 mmol/L 103 103 -  CO2 20 - 29 mmol/L 19(L) 24 -  Calcium 8.7 - 10.2 mg/dL 9.4 9.5 -  Total Protein 6.0 - 8.5 g/dL 7.0 7.1 7.6  Total Bilirubin 0.0 - 1.2 mg/dL 0.5 0.4 0.6  Alkaline Phos 44 - 121 IU/L 61 59 70  AST 0 - 40 IU/L 16 13 27   ALT 0 - 32 IU/L 14 9 22       Imaging: Radiology review:  2012's HIDA scan showed ejection fraction of 57%, no notation was present regarding any symptoms that were provoked due to the CCK administration.  CLINICAL DATA:  Four months of right upper quadrant abdominal pain   EXAM: ULTRASOUND ABDOMEN LIMITED RIGHT UPPER QUADRANT   COMPARISON:  January 27, 2014.   FINDINGS: Gallbladder:   No gallstones or wall thickening visualized. No sonographic Murphy sign noted by sonographer.   Common bile duct:   Diameter: 2 mm   Liver:   No solid focal lesion identified. 1 cm cyst in the right lobe of the liver. Within normal limits in parenchymal echogenicity. Portal vein is patent on color Doppler imaging with normal direction of blood flow towards the liver.   Other: None.   IMPRESSION: Unremarkable right upper quadrant ultrasound.     Electronically Signed   By: Dahlia Bailiff MD   On: 09/22/2020 17:17  Within last 24 hrs: No results found.  Assessment    Clinically consistent with chronic cholecystitis. Patient Active Problem List   Diagnosis Date Noted   History of adenomatous polyp of colon    Migraine 03/28/2021   Abdominal pain, chronic, right upper quadrant 03/28/2021   Last menstrual period (LMP) < 10 days ago 03/28/2021   Vitamin D deficiency 09/20/2020   Esophagitis, reflux 04/02/2009   Family history of cardiovascular disease 09/01/2008    Plan    Robotic cholecystectomy.  We discussed the options of repeat HIDA scan, utilization of CCK stimulation versus another attempt at ensure.  I believe this patient has suffered sufficiently enough, ruling out all the common sources of this type of right upper  quadrant pain. She desires to proceed with surgery.  She is aware that surgery will only alleviate the symptoms directly related to the gallbladder itself, and sometimes the only way to ensure resolution is over time once the gallbladder has been eliminated. This was discussed thoroughly.  Optimal plan is for robotic cholecystectomy.  Risks and benefits have been discussed with the patient which include but are not limited to anesthesia, bleeding, infection, biliary ductal injury or stenosis, other associated unanticipated injuries affiliated with laparoscopic surgery.  I believe there is the desire to proceed, interpreter utilized as needed.  Questions elicited and answered to satisfaction.  No guarantees ever expressed or implied.   Face-to-face time spent with the patient and accompanying care providers(if present) was 30 minutes, with more than 50% of the time spent counseling, educating, and coordinating care of the patient.    These notes generated with voice recognition software. I apologize for typographical errors.  Ronny Bacon M.D., FACS 05/03/2021, 10:44 AM

## 2021-05-03 NOTE — Telephone Encounter (Signed)
Patient returns call, she is now aware of all dates regarding her surgery and verbalized understanding.

## 2021-05-05 ENCOUNTER — Other Ambulatory Visit: Payer: Self-pay

## 2021-05-05 ENCOUNTER — Other Ambulatory Visit
Admission: RE | Admit: 2021-05-05 | Discharge: 2021-05-05 | Disposition: A | Discharge status: Home / Self Care | Payer: 59 | Source: Ambulatory Visit | Attending: Surgery | Admitting: Surgery

## 2021-05-05 DIAGNOSIS — Z01818 Encounter for other preprocedural examination: Secondary | ICD-10-CM | POA: Insufficient documentation

## 2021-05-05 NOTE — Patient Instructions (Addendum)
Your procedure is scheduled on: 05/11/21 - Wednesday Report to the Registration Desk on the 1st floor of the [facility]. To find out your arrival time, please call [phone] between 1PM - 3PM on: 05/10/21 - Tuesday Report to [facility] for Labs/EKG on 05/06/21 at 8:00 am.  REMEMBER: Instructions that are not followed completely may result in serious medical risk, up to and including death; or upon the discretion of your surgeon and anesthesiologist your surgery may need to be rescheduled.  Do not eat food after midnight the night before surgery.  No gum chewing, lozengers or hard candies.  You may however, drink CLEAR liquids up to 2 hours before you are scheduled to arrive for your surgery. Do not drink anything within 2 hours of your scheduled arrival time.  Clear liquids include: - water  - apple juice without pulp - gatorade (not RED, PURPLE, OR BLUE) - black coffee or tea (Do NOT add milk or creamers to the coffee or tea) Do NOT drink anything that is not on this list.  TAKE THESE MEDICATIONS THE MORNING OF SURGERY WITH A SIP OF WATER:  - omeprazole (PRILOSEC) 40 MG capsule, (take one the night before and one on the morning of surgery - helps to prevent nausea after surgery.)  One week prior to surgery: Stop Anti-inflammatories (NSAIDS) such as Advil, Aleve, Ibuprofen, Motrin, Naproxen, Naprosyn and Aspirin based products such as Excedrin, Goodys Powder, BC Powder.  Stop ANY OVER THE COUNTER supplements until after surgery.  You may however, continue to take Tylenol if needed for pain up until the day of surgery.  No Alcohol for 24 hours before or after surgery.  No Smoking including e-cigarettes for 24 hours prior to surgery.  No chewable tobacco products for at least 6 hours prior to surgery.  No nicotine patches on the day of surgery.  Do not use any "recreational" drugs for at least a week prior to your surgery.  Please be advised that the combination of  cocaine and anesthesia may have negative outcomes, up to and including death. If you test positive for cocaine, your surgery will be cancelled.  On the morning of surgery brush your teeth with toothpaste and water, you may rinse your mouth with mouthwash if you wish. Do not swallow any toothpaste or mouthwash.  Use CHG Soap or wipes as directed on instruction sheet.  Do not wear jewelry, make-up, hairpins, clips or nail polish.  Do not wear lotions, powders, or perfumes.   Do not shave body from the neck down 48 hours prior to surgery just in case you cut yourself which could leave a site for infection.  Also, freshly shaved skin may become irritated if using the CHG soap.  Contact lenses, hearing aids and dentures may not be worn into surgery.  Do not bring valuables to the hospital. [facility] is not responsible for any missing/lost belongings or valuables.   Notify your doctor if there is any change in your medical condition (cold, fever, infection).  Wear comfortable clothing (specific to your surgery type) to the hospital.  After surgery, you can help prevent lung complications by doing breathing exercises.  Take deep breaths and cough every 1-2 hours. Your doctor may order a device called an Incentive Spirometer to help you take deep breaths. When coughing or sneezing, hold a pillow firmly against your incision with both hands. This is called "splinting." Doing this helps protect your incision. It also decreases belly discomfort.  If you  are being admitted to the hospital overnight, leave your suitcase in the car. After surgery it may be brought to your room.  If you are being discharged the day of surgery, you will not be allowed to drive home. You will need a responsible adult (18 years or older) to drive you home and stay with you that night.   If you are taking public transportation, you will need to have a responsible adult (18 years or older) with you. Please confirm  with your physician that it is acceptable to use public transportation.   Please call the [place] Dept[facility] at [phone] if you have any questions about these instructions.  Surgery Visitation Policy:  Patients undergoing a surgery or procedure may have one family member or support person with them as long as that person is not COVID-19 positive or experiencing its symptoms.  That person may remain in the waiting area during the procedure and may rotate out with other people.  Inpatient Visitation:    Visiting hours are 7 a.m. to 8 p.m. Up to two visitors ages 16+ are allowed at one time in a patient room. The visitors may rotate out with other people during the day. Visitors must check out when they leave, or other visitors will not be allowed. One designated support person may remain overnight. The visitor must pass COVID-19 screenings, use hand sanitizer when entering and exiting the patient's room and wear a mask at all times, including in the patient's room. Patients must also wear a mask when staff or their visitor are in the room. Masking is required regardless of vaccination status.

## 2021-05-06 ENCOUNTER — Encounter: Payer: Self-pay | Admitting: Urgent Care

## 2021-05-06 ENCOUNTER — Other Ambulatory Visit
Admission: RE | Admit: 2021-05-06 | Discharge: 2021-05-06 | Disposition: A | Discharge status: Home / Self Care | Payer: 59 | Source: Ambulatory Visit | Attending: Surgery | Admitting: Surgery

## 2021-05-06 DIAGNOSIS — Z01818 Encounter for other preprocedural examination: Secondary | ICD-10-CM | POA: Diagnosis not present

## 2021-05-06 DIAGNOSIS — K811 Chronic cholecystitis: Secondary | ICD-10-CM

## 2021-05-06 DIAGNOSIS — R000 Tachycardia, unspecified: Secondary | ICD-10-CM | POA: Diagnosis not present

## 2021-05-06 LAB — CBC WITH DIFFERENTIAL/PLATELET
Abs Immature Granulocytes: 0.02 10*3/uL (ref 0.00–0.07)
Basophils Absolute: 0.1 10*3/uL (ref 0.0–0.1)
Basophils Relative: 1 %
Eosinophils Absolute: 0.2 10*3/uL (ref 0.0–0.5)
Eosinophils Relative: 3 %
HCT: 41 % (ref 36.0–46.0)
Hemoglobin: 14.3 g/dL (ref 12.0–15.0)
Immature Granulocytes: 0 %
Lymphocytes Relative: 28 %
Lymphs Abs: 2 10*3/uL (ref 0.7–4.0)
MCH: 30.3 pg (ref 26.0–34.0)
MCHC: 34.9 g/dL (ref 30.0–36.0)
MCV: 86.9 fL (ref 80.0–100.0)
Monocytes Absolute: 0.7 10*3/uL (ref 0.1–1.0)
Monocytes Relative: 9 %
Neutro Abs: 4.3 10*3/uL (ref 1.7–7.7)
Neutrophils Relative %: 59 %
Platelets: 198 10*3/uL (ref 150–400)
RBC: 4.72 MIL/uL (ref 3.87–5.11)
RDW: 12.6 % (ref 11.5–15.5)
WBC: 7.3 10*3/uL (ref 4.0–10.5)
nRBC: 0 % (ref 0.0–0.2)

## 2021-05-06 LAB — COMPREHENSIVE METABOLIC PANEL
ALT: 18 U/L (ref 0–44)
AST: 20 U/L (ref 15–41)
Albumin: 4.5 g/dL (ref 3.5–5.0)
Alkaline Phosphatase: 52 U/L (ref 38–126)
Anion gap: 10 (ref 5–15)
BUN: 13 mg/dL (ref 6–20)
CO2: 22 mmol/L (ref 22–32)
Calcium: 9.1 mg/dL (ref 8.9–10.3)
Chloride: 106 mmol/L (ref 98–111)
Creatinine, Ser: 0.92 mg/dL (ref 0.44–1.00)
GFR, Estimated: >60 mL/min (ref >60)
Glucose, Bld: 96 mg/dL (ref 70–99)
Potassium: 4.1 mmol/L (ref 3.5–5.1)
Sodium: 138 mmol/L (ref 135–145)
Total Bilirubin: 0.8 mg/dL (ref 0.3–1.2)
Total Protein: 7.2 g/dL (ref 6.5–8.1)

## 2021-05-11 ENCOUNTER — Encounter: Payer: Self-pay | Admitting: Surgery

## 2021-05-11 ENCOUNTER — Ambulatory Visit: Payer: 59 | Admitting: Certified Registered Nurse Anesthetist

## 2021-05-11 ENCOUNTER — Other Ambulatory Visit: Payer: Self-pay

## 2021-05-11 ENCOUNTER — Telehealth: Payer: Self-pay | Admitting: *Deleted

## 2021-05-11 ENCOUNTER — Encounter
Admission: RE | Disposition: A | Discharge status: Home / Self Care | Payer: Self-pay | Source: Ambulatory Visit | Attending: Surgery

## 2021-05-11 ENCOUNTER — Ambulatory Visit
Admission: RE | Admit: 2021-05-11 | Discharge: 2021-05-11 | Disposition: A | Discharge status: Home / Self Care | Payer: 59 | Source: Ambulatory Visit | Attending: Surgery | Admitting: Surgery

## 2021-05-11 DIAGNOSIS — K811 Chronic cholecystitis: Secondary | ICD-10-CM | POA: Insufficient documentation

## 2021-05-11 DIAGNOSIS — R1011 Right upper quadrant pain: Secondary | ICD-10-CM | POA: Diagnosis present

## 2021-05-11 LAB — POCT PREGNANCY, URINE: Preg Test, Ur: NEGATIVE

## 2021-05-11 SURGERY — CHOLECYSTECTOMY, ROBOT-ASSISTED, LAPAROSCOPIC
Anesthesia: General

## 2021-05-11 MED ORDER — FENTANYL CITRATE (PF) 100 MCG/2ML IJ SOLN
INTRAMUSCULAR | Status: AC
  Filled 2021-05-11: qty 2

## 2021-05-11 MED ORDER — INDOCYANINE GREEN 25 MG IV SOLR
2.5000 mg | Freq: Once | INTRAVENOUS | Status: AC
  Administered 2021-05-11: 13:00:00 2.5 mg via INTRAVENOUS
  Filled 2021-05-11: qty 1

## 2021-05-11 MED ORDER — GABAPENTIN 300 MG PO CAPS: 300.0000 mg | ORAL_CAPSULE | ORAL | Status: AC

## 2021-05-11 MED ORDER — DEXMEDETOMIDINE HCL IN NACL 200 MCG/50ML IV SOLN
INTRAVENOUS | Status: AC
  Filled 2021-05-11: qty 50

## 2021-05-11 MED ORDER — SCOPOLAMINE 1 MG/3DAYS TD PT72
MEDICATED_PATCH | TRANSDERMAL | Status: AC
  Administered 2021-05-11: 13:00:00 1.5 mg via TRANSDERMAL
  Filled 2021-05-11: qty 1

## 2021-05-11 MED ORDER — KETOROLAC TROMETHAMINE 30 MG/ML IJ SOLN
INTRAMUSCULAR | Status: AC
  Filled 2021-05-11: qty 1

## 2021-05-11 MED ORDER — LACTATED RINGERS IV SOLN: INTRAVENOUS | Status: DC | PRN

## 2021-05-11 MED ORDER — ONDANSETRON HCL 4 MG/2ML IJ SOLN
INTRAMUSCULAR | Status: DC | PRN
  Administered 2021-05-11: 4 mg via INTRAVENOUS

## 2021-05-11 MED ORDER — CHLORHEXIDINE GLUCONATE 0.12 % MT SOLN
OROMUCOSAL | Status: AC
  Administered 2021-05-11: 11:00:00 15 mL via OROMUCOSAL
  Filled 2021-05-11: qty 15

## 2021-05-11 MED ORDER — KETOROLAC TROMETHAMINE 30 MG/ML IJ SOLN
INTRAMUSCULAR | Status: DC | PRN
Start: 2021-05-11 — End: 2021-05-11
  Administered 2021-05-11: 30 mg via INTRAVENOUS

## 2021-05-11 MED ORDER — MIDAZOLAM HCL 2 MG/2ML IJ SOLN
INTRAMUSCULAR | Status: DC | PRN
  Administered 2021-05-11: 2 mg via INTRAVENOUS

## 2021-05-11 MED ORDER — FENTANYL CITRATE (PF) 100 MCG/2ML IJ SOLN
INTRAMUSCULAR | Status: DC | PRN
  Administered 2021-05-11 (×4): 50 ug via INTRAVENOUS

## 2021-05-11 MED ORDER — FENTANYL CITRATE (PF) 100 MCG/2ML IJ SOLN
25.0000 ug | INTRAMUSCULAR | Status: DC | PRN
  Administered 2021-05-11: 15:00:00 25 ug via INTRAVENOUS

## 2021-05-11 MED ORDER — PROPOFOL 10 MG/ML IV BOLUS
INTRAVENOUS | Status: DC | PRN
  Administered 2021-05-11: 170 mg via INTRAVENOUS

## 2021-05-11 MED ORDER — MIDAZOLAM HCL 2 MG/2ML IJ SOLN
INTRAMUSCULAR | Status: AC
  Filled 2021-05-11: qty 2

## 2021-05-11 MED ORDER — DEXMEDETOMIDINE (PRECEDEX) IN NS 20 MCG/5ML (4 MCG/ML) IV SYRINGE
PREFILLED_SYRINGE | INTRAVENOUS | Status: DC | PRN
  Administered 2021-05-11 (×2): 12 ug via INTRAVENOUS

## 2021-05-11 MED ORDER — ROCURONIUM BROMIDE 10 MG/ML (PF) SYRINGE
PREFILLED_SYRINGE | INTRAVENOUS | Status: AC
  Filled 2021-05-11: qty 10

## 2021-05-11 MED ORDER — LIDOCAINE HCL (CARDIAC) PF 100 MG/5ML IV SOSY
PREFILLED_SYRINGE | INTRAVENOUS | Status: DC | PRN
  Administered 2021-05-11: 100 mg via INTRAVENOUS

## 2021-05-11 MED ORDER — SUGAMMADEX SODIUM 200 MG/2ML IV SOLN
INTRAVENOUS | Status: DC | PRN
  Administered 2021-05-11: 200 mg via INTRAVENOUS

## 2021-05-11 MED ORDER — CEFAZOLIN SODIUM-DEXTROSE 2-4 GM/100ML-% IV SOLN
2.0000 g | INTRAVENOUS | Status: AC
  Administered 2021-05-11: 14:00:00 2 g via INTRAVENOUS

## 2021-05-11 MED ORDER — OXYCODONE HCL 5 MG PO TABS
5.0000 mg | ORAL_TABLET | Freq: Four times a day (QID) | ORAL | 0 refills | Status: DC | PRN
  Filled 2021-05-11: qty 15, 4d supply, fill #0

## 2021-05-11 MED ORDER — CEFAZOLIN SODIUM-DEXTROSE 2-4 GM/100ML-% IV SOLN
INTRAVENOUS | Status: AC
  Filled 2021-05-11: qty 100

## 2021-05-11 MED ORDER — DEXAMETHASONE SODIUM PHOSPHATE 10 MG/ML IJ SOLN
INTRAMUSCULAR | Status: AC
  Filled 2021-05-11: qty 1

## 2021-05-11 MED ORDER — PROPOFOL 10 MG/ML IV BOLUS
INTRAVENOUS | Status: AC
  Filled 2021-05-11: qty 40

## 2021-05-11 MED ORDER — ACETAMINOPHEN 500 MG PO TABS
ORAL_TABLET | ORAL | Status: AC
  Administered 2021-05-11: 11:00:00 1000 mg via ORAL
  Filled 2021-05-11: qty 2

## 2021-05-11 MED ORDER — 0.9 % SODIUM CHLORIDE (POUR BTL) OPTIME
TOPICAL | Status: DC | PRN
  Administered 2021-05-11: 15:00:00 100 mL

## 2021-05-11 MED ORDER — CHLORHEXIDINE GLUCONATE CLOTH 2 % EX PADS
6.0000 | MEDICATED_PAD | Freq: Once | CUTANEOUS | Status: AC
  Administered 2021-05-11: 12:00:00 6 via TOPICAL

## 2021-05-11 MED ORDER — CHLORHEXIDINE GLUCONATE 0.12 % MT SOLN: 15.0000 mL | Freq: Once | OROMUCOSAL | Status: AC

## 2021-05-11 MED ORDER — ORAL CARE MOUTH RINSE: 15.0000 mL | Freq: Once | OROMUCOSAL | Status: AC

## 2021-05-11 MED ORDER — PROMETHAZINE HCL 25 MG/ML IJ SOLN: 6.2500 mg | INTRAMUSCULAR | Status: DC | PRN

## 2021-05-11 MED ORDER — BUPIVACAINE-EPINEPHRINE (PF) 0.25% -1:200000 IJ SOLN
INTRAMUSCULAR | Status: AC
  Filled 2021-05-11: qty 30

## 2021-05-11 MED ORDER — PROPOFOL 500 MG/50ML IV EMUL
INTRAVENOUS | Status: DC | PRN
  Administered 2021-05-11: 110 ug/kg/min via INTRAVENOUS

## 2021-05-11 MED ORDER — SCOPOLAMINE 1 MG/3DAYS TD PT72: 1.0000 | MEDICATED_PATCH | TRANSDERMAL | Status: DC

## 2021-05-11 MED ORDER — LIDOCAINE HCL (PF) 2 % IJ SOLN
INTRAMUSCULAR | Status: AC
  Filled 2021-05-11: qty 5

## 2021-05-11 MED ORDER — BUPIVACAINE LIPOSOME 1.3 % IJ SUSP: 20.0000 mL | Freq: Once | INTRAMUSCULAR | Status: DC

## 2021-05-11 MED ORDER — ACETAMINOPHEN 500 MG PO TABS: 1000.0000 mg | ORAL_TABLET | ORAL | Status: AC

## 2021-05-11 MED ORDER — LACTATED RINGERS IV SOLN: INTRAVENOUS | Status: DC

## 2021-05-11 MED ORDER — ONDANSETRON HCL 4 MG/2ML IJ SOLN
INTRAMUSCULAR | Status: AC
  Filled 2021-05-11: qty 2

## 2021-05-11 MED ORDER — DEXAMETHASONE SODIUM PHOSPHATE 10 MG/ML IJ SOLN
INTRAMUSCULAR | Status: DC | PRN
  Administered 2021-05-11: 10 mg via INTRAVENOUS

## 2021-05-11 MED ORDER — ACETAMINOPHEN 10 MG/ML IV SOLN
INTRAVENOUS | Status: AC
  Filled 2021-05-11: qty 100

## 2021-05-11 MED ORDER — BUPIVACAINE-EPINEPHRINE 0.25% -1:200000 IJ SOLN
INTRAMUSCULAR | Status: DC | PRN
  Administered 2021-05-11: 50 mL

## 2021-05-11 MED ORDER — BUPIVACAINE LIPOSOME 1.3 % IJ SUSP
INTRAMUSCULAR | Status: AC
  Filled 2021-05-11: qty 20

## 2021-05-11 MED ORDER — ROCURONIUM BROMIDE 100 MG/10ML IV SOLN
INTRAVENOUS | Status: DC | PRN
  Administered 2021-05-11: 10 mg via INTRAVENOUS
  Administered 2021-05-11: 60 mg via INTRAVENOUS

## 2021-05-11 MED ORDER — PROPOFOL 1000 MG/100ML IV EMUL
INTRAVENOUS | Status: AC
  Filled 2021-05-11: qty 200

## 2021-05-11 MED ORDER — GABAPENTIN 300 MG PO CAPS
ORAL_CAPSULE | ORAL | Status: AC
  Administered 2021-05-11: 11:00:00 300 mg via ORAL
  Filled 2021-05-11: qty 1

## 2021-05-11 SURGICAL SUPPLY — 39 items
ADH SKN CLS APL DERMABOND .7 (GAUZE/BANDAGES/DRESSINGS) ×1
BAG SPEC RTRVL LRG 6X4 10 (ENDOMECHANICALS) ×1
CANNULA CAP OBTURATR AIRSEAL 8 (CAP) ×2 IMPLANT
CLIP LIGATING HEM O LOK PURPLE (MISCELLANEOUS) ×2 IMPLANT
COVER TIP SHEARS 8 DVNC (MISCELLANEOUS) ×1 IMPLANT
COVER TIP SHEARS 8MM DA VINCI (MISCELLANEOUS) ×1
DERMABOND ADVANCED (GAUZE/BANDAGES/DRESSINGS) ×1
DERMABOND ADVANCED .7 DNX12 (GAUZE/BANDAGES/DRESSINGS) ×1 IMPLANT
DRAPE ARM DVNC X/XI (DISPOSABLE) ×4 IMPLANT
DRAPE COLUMN DVNC XI (DISPOSABLE) ×1 IMPLANT
DRAPE DA VINCI XI ARM (DISPOSABLE) ×4
DRAPE DA VINCI XI COLUMN (DISPOSABLE) ×1
ELECT CAUTERY BLADE 6.4 (BLADE) ×2 IMPLANT
GAUZE 4X4 16PLY ~~LOC~~+RFID DBL (SPONGE) ×2 IMPLANT
GLOVE SURG ORTHO LTX SZ7.5 (GLOVE) ×4 IMPLANT
GOWN STRL REUS W/ TWL LRG LVL3 (GOWN DISPOSABLE) ×4 IMPLANT
GOWN STRL REUS W/TWL LRG LVL3 (GOWN DISPOSABLE) ×6
GRASPER SUT TROCAR 14GX15 (MISCELLANEOUS) ×1 IMPLANT
KIT PINK PAD W/HEAD ARE REST (MISCELLANEOUS) ×2
KIT PINK PAD W/HEAD ARM REST (MISCELLANEOUS) ×1 IMPLANT
LABEL OR SOLS (LABEL) ×2 IMPLANT
MANIFOLD NEPTUNE II (INSTRUMENTS) ×2 IMPLANT
NDL INSUFFLATION 14GA 120MM (NEEDLE) IMPLANT
NEEDLE HYPO 22GX1.5 SAFETY (NEEDLE) ×2 IMPLANT
NEEDLE INSUFFLATION 14GA 120MM (NEEDLE) ×2 IMPLANT
NS IRRIG 500ML POUR BTL (IV SOLUTION) ×2 IMPLANT
PACK LAP CHOLECYSTECTOMY (MISCELLANEOUS) ×2 IMPLANT
PENCIL ELECTRO HAND CTR (MISCELLANEOUS) ×2 IMPLANT
POUCH SPECIMEN RETRIEVAL 10MM (ENDOMECHANICALS) ×2 IMPLANT
SEAL CANN UNIV 5-8 DVNC XI (MISCELLANEOUS) ×3 IMPLANT
SEAL XI 5MM-8MM UNIVERSAL (MISCELLANEOUS) ×3
SET TUBE FILTERED XL AIRSEAL (SET/KITS/TRAYS/PACK) ×2 IMPLANT
SOLUTION ELECTROLUBE (MISCELLANEOUS) ×2 IMPLANT
SUT MNCRL 4-0 (SUTURE) ×2
SUT MNCRL 4-0 27XMFL (SUTURE) ×1
SUT VICRYL 0 AB UR-6 (SUTURE) ×2 IMPLANT
SUTURE MNCRL 4-0 27XMF (SUTURE) ×1 IMPLANT
TROCAR Z-THREAD FIOS 11X100 BL (TROCAR) ×1 IMPLANT
WATER STERILE IRR 500ML POUR (IV SOLUTION) ×2 IMPLANT

## 2021-05-11 NOTE — Transfer of Care (Signed)
Immediate Anesthesia Transfer of Care Note  Patient: Lynn Holt  Procedure(s) Performed: XI ROBOTIC ASSISTED LAPAROSCOPIC CHOLECYSTECTOMY INDOCYANINE GREEN FLUORESCENCE IMAGING (ICG)  Patient Location: PACU  Anesthesia Type:General  Level of Consciousness: awake, alert  and oriented  Airway & Oxygen Therapy: Patient Spontanous Breathing and Patient connected to nasal cannula oxygen  Post-op Assessment: Report given to RN and Post -op Vital signs reviewed and stable  Post vital signs: Reviewed and stable  Last Vitals:  Vitals Value Taken Time  BP 129/82 05/11/21 1515  Temp    Pulse 70 05/11/21 1515  Resp 38 05/11/21 1515  SpO2 99 % 05/11/21 1515  Vitals shown include unvalidated device data.  Last Pain:  Vitals:   05/11/21 1113  TempSrc: Temporal  PainSc: 0-No pain         Complications: No notable events documented.

## 2021-05-11 NOTE — Anesthesia Preprocedure Evaluation (Addendum)
Anesthesia Evaluation  Patient identified by MRN, date of birth, ID band Patient awake    Reviewed: Allergy & Precautions, H&P , NPO status , Patient's Chart, lab work & pertinent test results  History of Anesthesia Complications (+) PONV and history of anesthetic complications  Airway Mallampati: II  TM Distance: >3 FB Neck ROM: Full    Dental no notable dental hx.    Pulmonary neg pulmonary ROS, neg sleep apnea, neg COPD,    Pulmonary exam normal        Cardiovascular (-) angina(-) Past MI and (-) Cardiac Stents negative cardio ROS Normal cardiovascular exam(-) dysrhythmias      Neuro/Psych  Headaches, negative psych ROS   GI/Hepatic Neg liver ROS, Chronic cholecystitis Chronic nausea No vomiting    Endo/Other  negative endocrine ROS  Renal/GU      Musculoskeletal   Abdominal   Peds  Hematology negative hematology ROS (+)   Anesthesia Other Findings Past Medical History: No date: Endometriosis No date: Family history of adverse reaction to anesthesia     Comment:  nausea, Mom has a hard time waking up, dad has a hard               time staying asleep 03/28/2021: Fatigue 03/04/2009: Feeling bilious 09/05/2008: Hay fever No date: Headache No date: PONV (postoperative nausea and vomiting) 03/28/2021: Tachycardia  Past Surgical History: No date: APPENDECTOMY 04/26/2021: COLONOSCOPY WITH PROPOFOL; N/A     Comment:  Procedure: COLONOSCOPY WITH PROPOFOL;  Surgeon: Lin Landsman, MD;  Location: [facility] ENDOSCOPY;  Service:               Gastroenterology;  Laterality: N/A; 04/26/2021: ESOPHAGOGASTRODUODENOSCOPY; N/A     Comment:  Procedure: ESOPHAGOGASTRODUODENOSCOPY (EGD);  Surgeon:               Lin Landsman, MD;  Location: [facility] ENDOSCOPY;                Service: Gastroenterology;  Laterality: N/A; No date: labrium repair No date: OVARIAN CYST REMOVAL No date: SHOULDER SURGERY No  date: TONSILLECTOMY 08/27/2017: ULNAR NERVE TRANSPOSITION; Right     Comment:  Procedure: RIGHT ULNAR NERVE TRANSPOSITION AT THE ELBOW;              Surgeon: Milly Jakob, MD;  Location: [place];  Service: Orthopedics;  Laterality:               Right;     Reproductive/Obstetrics negative OB ROS                            Anesthesia Physical Anesthesia Plan  ASA: 2  Anesthesia Plan: General ETT   Post-op Pain Management:    Induction: Intravenous  PONV Risk Score and Plan: Ondansetron, Dexamethasone, Midazolam and Treatment may vary due to age or medical condition  Airway Management Planned: Oral ETT  Additional Equipment:   Intra-op Plan:   Post-operative Plan:   Informed Consent: I have reviewed the patients History and Physical, chart, labs and discussed the procedure including the risks, benefits and alternatives for the proposed anesthesia with the patient or authorized representative who has indicated his/her understanding and acceptance.     Dental Advisory Given  Plan Discussed with: Anesthesiologist, CRNA and Surgeon  Anesthesia Plan  Comments:        Anesthesia Quick Evaluation

## 2021-05-11 NOTE — Anesthesia Procedure Notes (Signed)
Procedure Name: Intubation Date/Time: 05/11/2021 2:02 PM Performed by: Demetrius Charity, CRNA Pre-anesthesia Checklist: Patient identified, Patient being monitored, Timeout performed, Emergency Drugs available and Suction available Patient Re-evaluated:Patient Re-evaluated prior to induction Oxygen Delivery Method: Circle system utilized Preoxygenation: Pre-oxygenation with 100% oxygen Induction Type: IV induction Ventilation: Mask ventilation without difficulty Laryngoscope Size: 3 and McGraph Grade View: Grade I Tube type: Oral Tube size: 6.5 mm Number of attempts: 1 Airway Equipment and Method: Stylet and Video-laryngoscopy Placement Confirmation: ETT inserted through vocal cords under direct vision, positive ETCO2 and breath sounds checked- equal and bilateral Secured at: 21 cm Tube secured with: Tape Dental Injury: Teeth and Oropharynx as per pre-operative assessment

## 2021-05-11 NOTE — Discharge Instructions (Signed)

## 2021-05-11 NOTE — Interval H&P Note (Signed)
History and Physical Interval Note:  05/11/2021 1:57 PM  Lynn Holt  has presented today for surgery, with the diagnosis of chronic cholecystitis.  The various methods of treatment have been discussed with the patient and family. After consideration of risks, benefits and other options for treatment, the patient has consented to  Procedure(s): XI ROBOTIC ASSISTED LAPAROSCOPIC CHOLECYSTECTOMY (N/A) [place] (ICG) (N/A) as a surgical intervention.  The patient's history has been reviewed, patient examined, no change in status, stable for surgery.  I have reviewed the patient's chart and labs.  Questions were answered to the patient's satisfaction.     Ronny Bacon

## 2021-05-11 NOTE — Telephone Encounter (Signed)
Faxed FMLA to [employer] at [phone] 

## 2021-05-11 NOTE — Op Note (Signed)
Robotic cholecystectomy with Indocyamine Green Ductal Imaging.   Pre-operative Diagnosis: Chronic cholecystitis  Post-operative Diagnosis:  Same.  Procedure: Robotic assisted laparoscopic cholecystectomy with Indocyamine Green Ductal Imaging.   Surgeon: Ronny Bacon, M.D., FACS  Anesthesia: General. with endotracheal tube  Findings: Scarring/adhesions to the body of the gallbladder.  Estimated Blood Loss: 5 mL         Drains: None         Specimens: Gallbladder           Complications: none  Procedure Details  The patient was seen again in the Holding Room.  2.5 mg dose of ICG was administered intravenously.   The benefits, complications, treatment options, risks and expected outcomes were again reviewed with the patient. The likelihood of improving the patient's symptoms with return to their baseline status is good.  The patient and/or family concurred with the proposed plan, giving informed consent, again alternatives reviewed.  The patient was taken to Operating Room, identified, and the procedure verified as robotic assisted laparoscopic cholecystectomy.  Prior to the induction of general anesthesia, antibiotic prophylaxis was administered. VTE prophylaxis was in place. General endotracheal anesthesia was then administered and tolerated well. The patient was positioned in the supine position.  After the induction, the abdomen was prepped with Chloraprep and draped in the sterile fashion.  A Time Out was held and the above information confirmed.  After local infiltration of quarter percent Marcaine with epinephrine, stab incision was made left upper quadrant.  Just below the costal margin at Palmer's point, approximately midclavicular line the Veres needle is passed with sensation of the layers to penetrate the abdominal wall and into the peritoneum.  Saline drop test is confirmed peritoneal placement.  Insufflation is initiated with carbon dioxide to pressures of 15 mmHg.  Right  infra-umbilical local infiltration with quarter percent Marcaine with epinephrine is utilized.  Made a 12 mm incision on the right periumbilical site, I advanced an optical 40mm port under direct visualization into the peritoneal cavity.  Once the peritoneum was penetrated, insufflation was initiated.  The trocar was then advanced into the abdominal cavity under direct visualization. Pneumoperitoneum was then continued with Air seal utilizing CO2 at 15 mmHg or less and tolerated well without any adverse changes in the patient's vital signs.  Two 8.5-mm ports were placed in the left lower quadrant and laterally, and one to the right lower quadrant, all under direct vision. All skin incisions  were infiltrated with a local anesthetic agent before making the incision and placing the trocars.  The patient was positioned  in reverse Trendelenburg, tilted the patient's left side down.  Da Vinci XI robot was then positioned on to the patient's left side, and docked.  The gallbladder was identified, the fundus grasped via the arm 4 Prograsp and retracted cephalad. Adhesions were lysed with scissors and cautery.  The infundibulum was identified grasped and retracted laterally, exposing the peritoneum overlying the triangle of Calot. This was then opened and dissected using cautery & scissors. An extended critical view of the cystic duct and cystic artery was obtained, aided by the ICG via FireFly which enabled ready visualization of the ductal anatomy.    The cystic duct was clearly identified and dissected to isolation.   Artery well isolated and clipped, and the cystic duct was triple clipped and divided with scissors, as close to the gallbladder neck as feasible, thus leaving two on the remaining stump.  The specimen side of the artery is sealed with bipolar  and divided with monopolar scissors.   The gallbladder was taken from the gallbladder fossa in a retrograde fashion with the electrocautery. The gallbladder  was removed and placed in an Endocatch bag.  The liver bed is inspected. Hemostasis was confirmed.  The robot was undocked and moved away from the operative field. Normal saline irrigation was utilized.  The gallbladder and Endocatch sac were then removed through the infraumbilical port site.   Inspection of the right upper quadrant was performed. No bleeding, bile duct injury or leak, or bowel injury was noted. The infra-umbilical port site fascia was closed with interrumpted 0 Vicryl sutures using PMI/cone under direct visualization. Pneumoperitoneum was released and ports removed.  4-0 subcuticular Monocryl was used to close the skin. Dermabond was  applied.  The patient was then extubated and brought to the recovery room in stable condition. Sponge, lap, and needle counts were correct at closure and at the conclusion of the case.               Ronny Bacon, M.D., [facility] 05/11/2021 3:12 PM

## 2021-05-11 NOTE — Anesthesia Postprocedure Evaluation (Signed)
Anesthesia Post Note  Patient: Sibyl Parr  Procedure(s) Performed: XI ROBOTIC ASSISTED LAPAROSCOPIC CHOLECYSTECTOMY INDOCYANINE GREEN FLUORESCENCE IMAGING (ICG)  Patient location during evaluation: PACU Anesthesia Type: General Level of consciousness: awake and alert Pain management: pain level controlled Vital Signs Assessment: post-procedure vital signs reviewed and stable Respiratory status: spontaneous breathing, nonlabored ventilation, respiratory function stable and patient connected to nasal cannula oxygen Cardiovascular status: blood pressure returned to baseline and stable Postop Assessment: no apparent nausea or vomiting Anesthetic complications: no   No notable events documented.   Last Vitals:  Vitals:   05/11/21 1545 05/11/21 1601  BP: (!) 124/96 128/83  Pulse: 73 70  Resp: 12 14  Temp: (!) 36.4 C (!) 36.1 C  SpO2: 95% 100%    Last Pain:  Vitals:   05/11/21 1601  TempSrc: Temporal  PainSc: 3                  Martha Clan

## 2021-05-13 LAB — SURGICAL PATHOLOGY

## 2021-05-25 ENCOUNTER — Other Ambulatory Visit: Payer: Self-pay

## 2021-05-25 ENCOUNTER — Encounter: Payer: Self-pay | Admitting: Physician Assistant

## 2021-05-25 ENCOUNTER — Ambulatory Visit (INDEPENDENT_AMBULATORY_CARE_PROVIDER_SITE_OTHER): Payer: 59 | Admitting: Physician Assistant

## 2021-05-25 VITALS — BP 142/100 | HR 99 | Temp 98.3°F | Ht 62.0 in | Wt 172.0 lb

## 2021-05-25 DIAGNOSIS — Z09 Encounter for follow-up examination after completed treatment for conditions other than malignant neoplasm: Secondary | ICD-10-CM

## 2021-05-25 DIAGNOSIS — K811 Chronic cholecystitis: Secondary | ICD-10-CM

## 2021-05-25 NOTE — Patient Instructions (Signed)

## 2021-05-25 NOTE — Progress Notes (Signed)
[facility] POST-OP OFFICE VISIT  05/26/2021  HPI: Lynn Holt is a 45 y.o. female 14 days s/p robotic assisted laparoscopic cholecystectomy for [facility] with Dr Christian Mate  Doing well, feels better than before surgery Slowly reintroducing diet No f/c/n/v/d Incisions are healing well  Vital signs: BP (!) 142/100    Pulse 99    Temp 98.3 F (36.8 C)    Ht 5\' 2"  (1.575 m)    Wt 172 lb (78 kg)    SpO2 96%    BMI 31.46 kg/m    Physical Exam: Constitutional: Well appearing female, NAD Abdomen: Soft, non-tender, non-distended, no rebound/guarding Skin: Laparoscopic incisions are well healed   Assessment/Plan: This is a 45 y.o. female 14 days s/p robotic assisted laparoscopic cholecystectomy    - Pain control prn  - Reviewed wound care  - Reviewed lifting restrictions; 4 weeks total  - Reviewed surgical pathology: [place]  - She can follow up on as needed basis   -- Edison Simon, PA-C [facility] 05/26/2021, 8:59 AM [phone] M-F: 7am - 4pm

## 2021-05-26 ENCOUNTER — Encounter: Payer: Self-pay | Admitting: Physician Assistant

## 2021-07-19 ENCOUNTER — Other Ambulatory Visit: Payer: Self-pay

## 2021-07-19 ENCOUNTER — Telehealth: Payer: 59 | Admitting: Physician Assistant

## 2021-07-19 DIAGNOSIS — U071 COVID-19: Secondary | ICD-10-CM

## 2021-07-19 MED ORDER — FLUTICASONE PROPIONATE 50 MCG/ACT NA SUSP
2.0000 | Freq: Every day | NASAL | 0 refills | Status: AC
  Filled 2021-07-19: qty 16, 30d supply, fill #0

## 2021-07-19 MED ORDER — BENZONATATE 100 MG PO CAPS
100.0000 mg | ORAL_CAPSULE | Freq: Three times a day (TID) | ORAL | 0 refills | Status: DC | PRN
  Filled 2021-07-19: qty 30, 10d supply, fill #0

## 2021-07-19 MED ORDER — MOLNUPIRAVIR EUA 200MG CAPSULE
4.0000 | ORAL_CAPSULE | Freq: Two times a day (BID) | ORAL | 0 refills | Status: AC
  Filled 2021-07-19: qty 40, 5d supply, fill #0

## 2021-07-19 MED ORDER — PSEUDOEPH-BROMPHEN-DM 30-2-10 MG/5ML PO SYRP
5.0000 mL | ORAL_SOLUTION | Freq: Four times a day (QID) | ORAL | 0 refills | Status: DC | PRN
  Filled 2021-07-19: qty 118, 6d supply, fill #0

## 2021-07-19 NOTE — Patient Instructions (Signed)
Lynn Holt, thank you for joining Mar Daring, PA-C for today's virtual visit.  While this provider is not your primary care provider (PCP), if your PCP is located in our provider database this encounter information will be shared with them immediately following your visit.  Consent: (Patient) Lynn Holt provided verbal consent for this virtual visit at the beginning of the encounter.  Current Medications:  Current Outpatient Medications:    benzonatate (TESSALON) 100 MG capsule, Take 1 capsule (100 mg total) by mouth 3 (three) times daily as needed., Disp: 30 capsule, Rfl: 0   brompheniramine-pseudoephedrine-DM 30-2-10 MG/5ML syrup, Take 5 mLs by mouth 4 (four) times daily as needed., Disp: 120 mL, Rfl: 0   fluticasone (FLONASE) 50 MCG/ACT nasal spray, Place 2 sprays into both nostrils daily., Disp: 16 g, Rfl: 0   molnupiravir EUA (LAGEVRIO) 200 mg CAPS capsule, Take 4 capsules (800 mg total) by mouth 2 (two) times daily for 5 days., Disp: 40 capsule, Rfl: 0   ibuprofen (ADVIL) 600 MG tablet, Take 600 mg by mouth every 6 (six) hours as needed. 600 to 1000mg , Disp: , Rfl:    medroxyPROGESTERone Acetate 150 MG/ML SUSY, INJECT 1 ML AS DIRECTED EVERY 3 MONTHS., Disp: 1 mL, Rfl: 3   Multiple Vitamin (MULTIVITAMIN) capsule, Take 1 capsule by mouth daily., Disp: , Rfl:    oxyCODONE (OXY IR/ROXICODONE) 5 MG immediate release tablet, Take 1 tablet (5 mg total) by mouth every 6 (six) hours as needed for severe pain. (Patient not taking: Reported on 05/25/2021), Disp: 15 tablet, Rfl: 0   Medications ordered in this encounter:  Meds ordered this encounter  Medications   molnupiravir EUA (LAGEVRIO) 200 mg CAPS capsule    Sig: Take 4 capsules (800 mg total) by mouth 2 (two) times daily for 5 days.    Dispense:  40 capsule    Refill:  0    Order Specific Question:   Supervising Provider    Answer:   Sabra Heck, BRIAN [[id number]]   brompheniramine-pseudoephedrine-DM 30-2-10 MG/5ML syrup     Sig: Take 5 mLs by mouth 4 (four) times daily as needed.    Dispense:  120 mL    Refill:  0    Order Specific Question:   Supervising Provider    Answer:   MILLER, BRIAN [[id number]]   benzonatate (TESSALON) 100 MG capsule    Sig: Take 1 capsule (100 mg total) by mouth 3 (three) times daily as needed.    Dispense:  30 capsule    Refill:  0    Order Specific Question:   Supervising Provider    Answer:   MILLER, BRIAN [[id number]]   fluticasone (FLONASE) 50 MCG/ACT nasal spray    Sig: Place 2 sprays into both nostrils daily.    Dispense:  16 g    Refill:  0    Order Specific Question:   Supervising Provider    Answer:   Sabra Heck, [place]     *If you need refills on other medications prior to your next appointment, please contact your pharmacy*  Follow-Up: Call back or seek an in-person evaluation if the symptoms worsen or if the condition fails to improve as anticipated.  Other Instructions 10 Things You Can Do to Manage Your COVID-19 Symptoms at Home If you have possible or confirmed COVID-19 Stay home except to get medical care. Monitor your symptoms carefully. If your symptoms get worse, call your healthcare provider immediately. Get rest and stay hydrated. If you have a  medical appointment, call the healthcare provider ahead of time and tell them that you have or may have COVID-19. For medical emergencies, call 911 and notify the dispatch personnel that you have or may have COVID-19. Cover your cough and sneezes with a tissue or use the inside of your elbow. Wash your hands often with soap and water for at least 20 seconds or clean your hands with an alcohol-based hand sanitizer that contains at least 60% alcohol. As much as possible, stay in a specific room and away from other people in your home. Also, you should use a separate bathroom, if available. If you need to be around other people in or outside of the home, wear a mask. Avoid sharing personal items with other people in your  household, like dishes, towels, and bedding. Clean all surfaces that are touched often, like counters, tabletops, and doorknobs. Use household cleaning sprays or wipes according to the label instructions. [email or web address] 12/12/2019 This information is not intended to replace advice given to you by your health care provider. Make sure you discuss any questions you have with your health care provider. Document Revised: 02/04/2021 Document Reviewed: 02/04/2021 Elsevier Patient Education  2022 [place] Oral Capsules What is this medication? MOLNUPIRAVIR (mol nue pir a vir) treats COVID-19. It is an antiviral medication. It may decrease the risk of developing severe symptoms of COVID-19. It may also decrease the chance of going to the hospital. This medication is not approved by the FDA. The FDA has authorized emergency use of this medication during the COVID-19 pandemic. This medicine may be used for other purposes; ask your health care provider or pharmacist if you have questions. COMMON BRAND NAME(S): LAGEVRIO What should I tell my care team before I take this medication? They need to know if you have any of these conditions: Any allergies Any serious illness An unusual or allergic reaction to molnupiravir, other medications, foods, dyes, or preservatives Pregnant or trying to get pregnant Breast-feeding How should I use this medication? Take this medication by mouth with water. Take it as directed on the prescription label at the same time every day. Do not cut, crush or chew this medication. Swallow the capsules whole. You can take it with or without food. If it upsets your stomach, take it with food. Take all of this medication unless your care team tells you to stop it early. Keep taking it even if you think you are better. Talk to your care team about the use of this medication in children. Special care may be needed. Overdosage: If you think you have taken too  much of this medicine contact a poison control center or emergency room at once. NOTE: This medicine is only for you. Do not share this medicine with others. What if I miss a dose? If you miss a dose, take it as soon as you can unless it is more than 10 hours late. If it is more than 10 hours late, skip the missed dose. Take the next dose at the normal time. Do not take extra or 2 doses at the same time to make up for the missed dose. What may interact with this medication? Interactions have not been studied. This list may not describe all possible interactions. Give your health care provider a list of all the medicines, herbs, non-prescription drugs, or dietary supplements you use. Also tell them if you smoke, drink alcohol, or use illegal drugs. Some items may interact with your medicine. What should  I watch for while using this medication? Your condition will be monitored carefully while you are receiving this medication. Visit your care team for regular checkups. Tell your care team if your symptoms do not start to get better or if they get worse. Do not become pregnant while taking this medication. You may need a pregnancy test before starting this medication. Women must use a reliable form of birth control while taking this medication and for 4 days after stopping the medication. Women should inform their care team if they wish to become pregnant or think they might be pregnant. Men should not father a child while taking this medication and for 3 months after stopping it. There is potential for serious harm to an unborn child. Talk to your care team for more information. Do not breast-feed an infant while taking this medication and for 4 days after stopping the medication. What side effects may I notice from receiving this medication? Side effects that you should report to your care team as soon as possible: Allergic reactions--skin rash, itching, hives, swelling of the face, lips, tongue, or  throat Side effects that usually do not require medical attention (report these to your care team if they continue or are bothersome): Diarrhea Dizziness Nausea This list may not describe all possible side effects. Call your doctor for medical advice about side effects. You may report side effects to FDA at 1-800-FDA-1088. Where should I keep my medication? Keep out of the reach of children and pets. Store at room temperature between 20 and 25 degrees C (68 and 77 degrees F). Get rid of any unused medication after the expiration date. To get rid of medications that are no longer needed or have expired: Take the medication to a medication take-back program. Check with your pharmacy or law enforcement to find a location. If you cannot return the medication, check the label or package insert to see if the medication should be thrown out in the garbage or flushed down the toilet. If you are not sure, ask your care team. If it is safe to put it in the trash, take the medication out of the container. Mix the medication with cat litter, dirt, coffee grounds, or other unwanted substance. Seal the mixture in a bag or container. Put it in the trash. NOTE: This sheet is a summary. It may not cover all possible information. If you have questions about this medicine, talk to your doctor, pharmacist, or health care provider.  2022 Elsevier/Gold Standard (2020-05-24 00:00:00)    If you have been instructed to have an in-person evaluation today at a local Urgent Care facility, please use the link below. It will take you to a list of all of our available [facility], including address, phone number and hours of operation. Please do not delay care.  [facility]  If you or a family member do not have a primary care provider, use the link below to schedule a visit and establish care. When you choose a [facility] primary care physician or advanced practice provider, you gain a long-term  partner in health. Find a Primary Care Provider  Learn more about [facility]'s in-office and virtual care options: [facility]

## 2021-07-19 NOTE — Progress Notes (Signed)
Virtual Visit Consent   Sibyl Parr, you are scheduled for a virtual visit with a [facility] provider today.     Just as with appointments in the office, your consent must be obtained to participate.  Your consent will be active for this visit and any virtual visit you may have with one of our providers in the next 365 days.     If you have a MyChart account, a copy of this consent can be sent to you electronically.  All virtual visits are billed to your insurance company just like a traditional visit in the office.    As this is a virtual visit, video technology does not allow for your provider to perform a traditional examination.  This may limit your provider's ability to fully assess your condition.  If your provider identifies any concerns that need to be evaluated in person or the need to arrange testing (such as labs, EKG, etc.), we will make arrangements to do so.     Although advances in technology are sophisticated, we cannot ensure that it will always work on either your end or our end.  If the connection with a video visit is poor, the visit may have to be switched to a telephone visit.  With either a video or telephone visit, we are not always able to ensure that we have a secure connection.     I need to obtain your verbal consent now.   Are you willing to proceed with your visit today?    Lynn Holt has provided verbal consent on 07/19/2021 for a virtual visit (video or telephone).   Mar Daring, PA-C   Date: 07/19/2021 8:06 AM   Virtual Visit via Video Note   I, Mar Daring, connected with  Lynn Holt  ([id number], [birth year]) on 07/19/21 at  8:00 AM EST by a video-enabled telemedicine application and verified that I am speaking with the correct person using two identifiers.  Location: Patient: Virtual Visit Location Patient: Home Provider: Virtual Visit Location Provider: Home Office   I discussed the limitations of evaluation and management  by telemedicine and the availability of in person appointments. The patient expressed understanding and agreed to proceed.    History of Present Illness: Lynn Holt is a 46 y.o. who identifies as a female who was assigned female at birth, and is being seen today for Covid 8.  HPI: URI  This is a new problem. The current episode started in the past 7 days (tested positive for covid 19 today; symptoms started sunday morning). The problem has been gradually worsening. Maximum temperature: subjective fevers. Associated symptoms include congestion, coughing, ear pain, headaches, a plugged ear sensation, rhinorrhea and a sore throat. Pertinent negatives include no diarrhea, nausea, sinus pain or vomiting. Associated symptoms comments: Post nasal drainage, fatigue. Treatments tried: allergy medications, dayquil. The treatment provided mild relief.     Problems:  Patient Active Problem List   Diagnosis Date Noted   Chronic cholecystitis 05/03/2021   History of adenomatous polyp of colon    Migraine 03/28/2021   Abdominal pain, chronic, right upper quadrant 03/28/2021   Last menstrual period (LMP) < 10 days ago 03/28/2021   Vitamin D deficiency 09/20/2020   Esophagitis, reflux 04/02/2009   Family history of cardiovascular disease 09/01/2008    Allergies:  Allergies  Allergen Reactions   Morphine And Related     Fever, Skin Rashes   Mucinex Fast-Max Nausea And Vomiting   Other  Walnuts - blisters in mouth and throat swelling    Ultram  [Tramadol] Nausea And Vomiting   Medications:  Current Outpatient Medications:    benzonatate (TESSALON) 100 MG capsule, Take 1 capsule (100 mg total) by mouth 3 (three) times daily as needed., Disp: 30 capsule, Rfl: 0   brompheniramine-pseudoephedrine-DM 30-2-10 MG/5ML syrup, Take 5 mLs by mouth 4 (four) times daily as needed., Disp: 120 mL, Rfl: 0   fluticasone (FLONASE) 50 MCG/ACT nasal spray, Place 2 sprays into both nostrils daily., Disp: 16 g,  Rfl: 0   molnupiravir EUA (LAGEVRIO) 200 mg CAPS capsule, Take 4 capsules (800 mg total) by mouth 2 (two) times daily for 5 days., Disp: 40 capsule, Rfl: 0   ibuprofen (ADVIL) 600 MG tablet, Take 600 mg by mouth every 6 (six) hours as needed. 600 to 1000mg , Disp: , Rfl:    medroxyPROGESTERone Acetate 150 MG/ML SUSY, INJECT 1 ML AS DIRECTED EVERY 3 MONTHS., Disp: 1 mL, Rfl: 3   Multiple Vitamin (MULTIVITAMIN) capsule, Take 1 capsule by mouth daily., Disp: , Rfl:    oxyCODONE (OXY IR/ROXICODONE) 5 MG immediate release tablet, Take 1 tablet (5 mg total) by mouth every 6 (six) hours as needed for severe pain. (Patient not taking: Reported on 05/25/2021), Disp: 15 tablet, Rfl: 0  Observations/Objective: Patient is well-developed, well-nourished in no acute distress.  Patient appears ill Resting comfortably  at home.  Head is normocephalic, atraumatic.  No labored breathing. Speech is clear and coherent with logical content.  Patient is alert and oriented at baseline.    Assessment and Plan: 1. COVID-19 - molnupiravir EUA (LAGEVRIO) 200 mg CAPS capsule; Take 4 capsules (800 mg total) by mouth 2 (two) times daily for 5 days.  Dispense: 40 capsule; Refill: 0 - brompheniramine-pseudoephedrine-DM 30-2-10 MG/5ML syrup; Take 5 mLs by mouth 4 (four) times daily as needed.  Dispense: 120 mL; Refill: 0 - benzonatate (TESSALON) 100 MG capsule; Take 1 capsule (100 mg total) by mouth 3 (three) times daily as needed.  Dispense: 30 capsule; Refill: 0 - fluticasone (FLONASE) 50 MCG/ACT nasal spray; Place 2 sprays into both nostrils daily.  Dispense: 16 g; Refill: 0  - Continue OTC symptomatic management of choice - Will send OTC vitamins and supplement information through AVS - Molnupiravir prescribed - Tessalon perles and Bromfed DM for cough; flonase for nasal and sinus congestion - Patient enrolled in MyChart symptom monitoring - Push fluids - Rest as needed - Discussed return precautions and when to  seek in-person evaluation, sent via AVS as well   Follow Up Instructions: I discussed the assessment and treatment plan with the patient. The patient was provided an opportunity to ask questions and all were answered. The patient agreed with the plan and demonstrated an understanding of the instructions.  A copy of instructions were sent to the patient via MyChart unless otherwise noted below.   The patient was advised to call back or seek an in-person evaluation if the symptoms worsen or if the condition fails to improve as anticipated.  Time:  I spent 15 minutes with the patient via telehealth technology discussing the above problems/concerns.    Mar Daring, PA-C

## 2021-08-03 ENCOUNTER — Other Ambulatory Visit: Payer: Self-pay

## 2021-08-03 ENCOUNTER — Other Ambulatory Visit: Payer: Self-pay | Admitting: Obstetrics & Gynecology

## 2021-08-03 DIAGNOSIS — N920 Excessive and frequent menstruation with regular cycle: Secondary | ICD-10-CM

## 2021-08-09 ENCOUNTER — Other Ambulatory Visit: Payer: Self-pay

## 2021-08-09 MED ORDER — MEDROXYPROGESTERONE ACETATE 150 MG/ML IM SUSY
PREFILLED_SYRINGE | INTRAMUSCULAR | 0 refills | Status: DC
  Filled 2021-08-09: qty 1, 84d supply, fill #0

## 2021-08-09 NOTE — Telephone Encounter (Signed)
Pt calling; has scheduled annual for 4/18th; needs refill.  [phone] Verified pharm c pt and adv refill is being eRx'd. - depo. ?

## 2021-08-09 NOTE — Addendum Note (Signed)
Addended by: Cleophas Dunker D on: 08/09/2021 04:23 PM ? ? Modules accepted: Orders ? ?

## 2021-09-13 ENCOUNTER — Other Ambulatory Visit: Payer: Self-pay

## 2021-09-13 ENCOUNTER — Encounter: Payer: Self-pay | Admitting: Obstetrics and Gynecology

## 2021-09-13 ENCOUNTER — Ambulatory Visit (INDEPENDENT_AMBULATORY_CARE_PROVIDER_SITE_OTHER): Payer: 59 | Admitting: Obstetrics and Gynecology

## 2021-09-13 VITALS — BP 114/80 | Ht 62.0 in | Wt 178.0 lb

## 2021-09-13 DIAGNOSIS — E785 Hyperlipidemia, unspecified: Secondary | ICD-10-CM

## 2021-09-13 DIAGNOSIS — Z803 Family history of malignant neoplasm of breast: Secondary | ICD-10-CM

## 2021-09-13 DIAGNOSIS — Z0000 Encounter for general adult medical examination without abnormal findings: Secondary | ICD-10-CM | POA: Diagnosis not present

## 2021-09-13 DIAGNOSIS — Z3042 Encounter for surveillance of injectable contraceptive: Secondary | ICD-10-CM | POA: Diagnosis not present

## 2021-09-13 DIAGNOSIS — Z1231 Encounter for screening mammogram for malignant neoplasm of breast: Secondary | ICD-10-CM

## 2021-09-13 DIAGNOSIS — Z01419 Encounter for gynecological examination (general) (routine) without abnormal findings: Secondary | ICD-10-CM | POA: Diagnosis not present

## 2021-09-13 MED ORDER — MEDROXYPROGESTERONE ACETATE 150 MG/ML IM SUSY
PREFILLED_SYRINGE | INTRAMUSCULAR | 3 refills | Status: DC
  Filled 2021-09-13: qty 1, fill #0
  Filled 2021-10-25: qty 1, 84d supply, fill #0
  Filled 2022-01-11: qty 1, 84d supply, fill #1
  Filled 2022-04-01: qty 1, 84d supply, fill #2
  Filled 2022-06-12: qty 1, 84d supply, fill #3

## 2021-09-13 NOTE — Patient Instructions (Addendum)
I value your feedback and you entrusting us with your care. If you get a [facility] patient survey, I would appreciate you taking the time to let us know about your experience today. Thank you!  [facility]: [phone]      

## 2021-09-13 NOTE — Progress Notes (Signed)
? ?PCP: Birdie Sons, MD ? ? ?Chief Complaint  ?Patient presents with  ? Gynecologic Exam  ?  Hot flashes since November 2022  ? ? ?HPI: ?     [name] is a 46 y.o. G0P0000 whose LMP was No LMP recorded. Patient has had an injection., presents today for her annual examination.  Her menses are absent due to depo for endometriosis; has rare BTB/no dysmen. Starting to have some occas hot flashes.   ? ?Sex activity: single partner, contraception - Depo-Provera injections. She does not have vaginal dryness. ? ?Last Pap: 07/12/20  Results were: no abnormalities /neg HPV DNA.  ? ?Last mammogram: 03/17/21 Results were: normal with bilat breast cysts--routine follow-up in 12 months ?There is a FH of breast cancer in her MGM and mat aunt, genetic testing not indicated. There is no FH of ovarian cancer. The patient does do self-breast exams. ? ?Colonoscopy: 11/22 with Dr. Marius Ditch with polyps; Repeat due after 5 years.  ? ?Tobacco use: The patient denies current or previous tobacco use. ?Alcohol use: social drinker ?No drug use ?Exercise: moderately active ? ?She does get adequate calcium and Vitamin D in her diet. ? ?Labs 2/22 were normal except hyperlipidemia. Due for repeat labs this yr ? ? ?Patient Active Problem List  ? Diagnosis Date Noted  ? Elevated lipids 09/13/2021  ? Chronic cholecystitis 05/03/2021  ? History of adenomatous polyp of colon   ? Migraine 03/28/2021  ? Abdominal pain, chronic, right upper quadrant 03/28/2021  ? Last menstrual period (LMP) < 10 days ago 03/28/2021  ? Vitamin D deficiency 09/20/2020  ? Esophagitis, reflux 04/02/2009  ? Family history of cardiovascular disease 09/01/2008  ? ? ?Past Surgical History:  ?Procedure Laterality Date  ? APPENDECTOMY    ? COLONOSCOPY WITH PROPOFOL N/A 04/26/2021  ? Procedure: COLONOSCOPY WITH PROPOFOL;  Surgeon: Lin Landsman, MD;  Location: [facility] ENDOSCOPY;  Service: Gastroenterology;  Laterality: N/A;  ? ESOPHAGOGASTRODUODENOSCOPY N/A  04/26/2021  ? Procedure: ESOPHAGOGASTRODUODENOSCOPY (EGD);  Surgeon: Lin Landsman, MD;  Location: [facility] ENDOSCOPY;  Service: Gastroenterology;  Laterality: N/A;  ? labrium repair    ? OVARIAN CYST REMOVAL    ? SHOULDER SURGERY    ? TONSILLECTOMY    ? ULNAR NERVE TRANSPOSITION Right 08/27/2017  ? Procedure: RIGHT ULNAR NERVE TRANSPOSITION AT THE ELBOW;  Surgeon: Milly Jakob, MD;  Location: [facility];  Service: Orthopedics;  Laterality: Right;  ? ? ?Family History  ?Problem Relation Age of Onset  ? Fibromyalgia Mother   ? Rheum arthritis Father   ? Heart attack Father   ? Breast cancer Maternal Aunt 42  ? Breast cancer Maternal Grandmother 22  ? Colon cancer Maternal Grandmother   ? Lung cancer Maternal Grandmother   ? Heart attack Paternal Grandfather   ? Esophageal cancer Paternal Grandfather   ? Neuropathy Neg Hx   ? ? ?Social History  ? ?Socioeconomic History  ? Marital status: Married  ?  Spouse name: Legrand Como  ? Number of children: 0  ? Years of education: Masters   ? Highest education level: Not on file  ?Occupational History  ? Not on file  ?Tobacco Use  ? Smoking status: Never  ? Smokeless tobacco: Never  ?Vaping Use  ? Vaping Use: Never used  ?Substance and Sexual Activity  ? Alcohol use: Yes  ?  Comment: occas  ? Drug use: No  ? Sexual activity: Yes  ?  Birth control/protection: None  ?Other Topics  Concern  ? Not on file  ?Social History Narrative  ? Drinks 1 caffeine drink a day   ? ?Social Determinants of Health  ? ?Financial Resource Strain: Not on file  ?Food Insecurity: Not on file  ?Transportation Needs: Not on file  ?Physical Activity: Not on file  ?Stress: Not on file  ?Social Connections: Not on file  ?Intimate Partner Violence: Not on file  ? ? ? ?Current Outpatient Medications:  ?  fluticasone (FLONASE) 50 MCG/ACT nasal spray, Place 2 sprays into both nostrils daily., Disp: 16 g, Rfl: 0 ?  Multiple Vitamin (MULTIVITAMIN) capsule, Take 1 capsule by mouth daily., Disp: , Rfl:   ?  medroxyPROGESTERone Acetate 150 MG/ML SUSY, INJECT 1 ML AS DIRECTED EVERY 3 MONTHS., Disp: 1 mL, Rfl: 3 ? ? ? ? ?ROS: ? ?Review of Systems  ?Constitutional:  Negative for fatigue, fever and unexpected weight change.  ?Respiratory:  Negative for cough, shortness of breath and wheezing.   ?Cardiovascular:  Negative for chest pain, palpitations and leg swelling.  ?Gastrointestinal:  Negative for blood in stool, constipation, diarrhea, nausea and vomiting.  ?Endocrine: Negative for cold intolerance, heat intolerance and polyuria.  ?Genitourinary:  Negative for dyspareunia, dysuria, flank pain, frequency, genital sores, hematuria, menstrual problem, pelvic pain, urgency, vaginal bleeding, vaginal discharge and vaginal pain.  ?Musculoskeletal:  Positive for arthralgias. Negative for back pain, joint swelling and myalgias.  ?Skin:  Negative for rash.  ?Neurological:  Negative for dizziness, syncope, light-headedness, numbness and headaches.  ?Hematological:  Negative for adenopathy.  ?Psychiatric/Behavioral:  Positive for agitation. Negative for confusion, sleep disturbance and suicidal ideas. The patient is not nervous/anxious.   ?BREAST: No symptoms ? ? ? ?Objective: ?BP 114/80   Ht '5\' 2"'$  (1.575 m)   Wt 178 lb (80.7 kg)   BMI 32.56 kg/m?  ? ? ?Physical Exam ?Constitutional:   ?   Appearance: She is well-developed.  ?Genitourinary:  ?   Vulva normal.  ?   Right Labia: No rash, tenderness or lesions. ?   Left Labia: No tenderness, lesions or rash. ?   No vaginal discharge, erythema or tenderness.  ? ?   Right Adnexa: not tender and no mass present. ?   Left Adnexa: not tender and no mass present. ?   No cervical friability or polyp.  ?   Uterus is not enlarged or tender.  ?Breasts: ?   Right: No mass, nipple discharge, skin change or tenderness.  ?   Left: No mass, nipple discharge, skin change or tenderness.  ?Neck:  ?   Thyroid: No thyromegaly.  ?Cardiovascular:  ?   Rate and Rhythm: Normal rate and regular  rhythm.  ?   Heart sounds: Normal heart sounds. No murmur heard. ?Pulmonary:  ?   Effort: Pulmonary effort is normal.  ?   Breath sounds: Normal breath sounds.  ?Abdominal:  ?   Palpations: Abdomen is soft.  ?   Tenderness: There is no abdominal tenderness. There is no guarding or rebound.  ?Musculoskeletal:     ?   General: Normal range of motion.  ?   Cervical back: Normal range of motion.  ?Lymphadenopathy:  ?   Cervical: No cervical adenopathy.  ?Neurological:  ?   General: No focal deficit present.  ?   Mental Status: She is alert and oriented to person, place, and time.  ?   Cranial Nerves: No cranial nerve deficit.  ?Skin: ?   General: Skin is warm and dry.  ?Psychiatric:     ?  Mood and Affect: Mood normal.     ?   Behavior: Behavior normal.     ?   Thought Content: Thought content normal.     ?   Judgment: Judgment normal.  ?Vitals reviewed.  ? ? ?Assessment/Plan: ? ?Encounter for annual routine gynecological examination ? ?Encounter for surveillance of injectable contraceptive - Plan: medroxyPROGESTERone Acetate 150 MG/ML SUSY; Depo RF eRxd. Cont ca/Vit D ? ?Encounter for screening mammogram for malignant neoplasm of breast - Plan: MM 3D SCREEN BREAST BILATERAL; pt to schedule mammo ? ?Family history of breast cancer--genetic testing not indicated currently but will cont to follow FH ? ?Elevated lipids - Plan: Lipid panel; repeat labs. Will f/u with results ? ?Blood tests for routine general physical examination - Plan: Lipid panel, Comprehensive metabolic panel ? ? ?Meds ordered this encounter  ?Medications  ? medroxyPROGESTERone Acetate 150 MG/ML SUSY  ?  Sig: INJECT 1 ML AS DIRECTED EVERY 3 MONTHS.  ?  Dispense:  1 mL  ?  Refill:  3  ?  Order Specific Question:   Supervising Provider  ?  AnswerGae Dry [[id number]]  ? ? ?        ?GYN counsel breast self exam, mammography screening, adequate intake of calcium and vitamin D, diet and exercise ? ?  F/U ? Return in about 1 year (around  09/14/2022). ? ?Chattie Greeson B. Cariah Salatino, PA-C ?09/13/2021 ?11:32 AM ? ?

## 2021-09-23 ENCOUNTER — Other Ambulatory Visit: Payer: 59

## 2021-09-23 DIAGNOSIS — E785 Hyperlipidemia, unspecified: Secondary | ICD-10-CM

## 2021-09-23 DIAGNOSIS — Z0000 Encounter for general adult medical examination without abnormal findings: Secondary | ICD-10-CM

## 2021-09-24 LAB — COMPREHENSIVE METABOLIC PANEL
ALT: 19 IU/L (ref 0–32)
AST: 24 IU/L (ref 0–40)
Albumin/Globulin Ratio: 2.7 — ABNORMAL HIGH (ref 1.2–2.2)
Albumin: 5.2 g/dL — ABNORMAL HIGH (ref 3.8–4.8)
Alkaline Phosphatase: 68 IU/L (ref 44–121)
BUN/Creatinine Ratio: 24 — ABNORMAL HIGH (ref 9–23)
BUN: 20 mg/dL (ref 6–24)
Bilirubin Total: 0.3 mg/dL (ref 0.0–1.2)
CO2: 14 mmol/L — ABNORMAL LOW (ref 20–29)
Calcium: 9.9 mg/dL (ref 8.7–10.2)
Chloride: 110 mmol/L — ABNORMAL HIGH (ref 96–106)
Creatinine, Ser: 0.83 mg/dL (ref 0.57–1.00)
Globulin, Total: 1.9 g/dL (ref 1.5–4.5)
Glucose: 94 mg/dL (ref 70–99)
Potassium: 4.5 mmol/L (ref 3.5–5.2)
Sodium: 145 mmol/L — ABNORMAL HIGH (ref 134–144)
Total Protein: 7.1 g/dL (ref 6.0–8.5)
eGFR: 89 mL/min/{1.73_m2} (ref >59)

## 2021-09-24 LAB — LIPID PANEL
Chol/HDL Ratio: 3.2 ratio (ref 0.0–4.4)
Cholesterol, Total: 277 mg/dL — ABNORMAL HIGH (ref 100–199)
HDL: 86 mg/dL (ref >39)
LDL Chol Calc (NIH): 176 mg/dL — ABNORMAL HIGH (ref 0–99)
Triglycerides: 90 mg/dL (ref 0–149)
VLDL Cholesterol Cal: 15 mg/dL (ref 5–40)

## 2021-09-27 ENCOUNTER — Encounter: Payer: Self-pay | Admitting: Obstetrics and Gynecology

## 2021-09-27 DIAGNOSIS — E785 Hyperlipidemia, unspecified: Secondary | ICD-10-CM

## 2021-10-25 ENCOUNTER — Other Ambulatory Visit: Payer: Self-pay

## 2022-01-11 ENCOUNTER — Other Ambulatory Visit (HOSPITAL_COMMUNITY): Payer: Self-pay

## 2022-04-03 ENCOUNTER — Other Ambulatory Visit (HOSPITAL_COMMUNITY): Payer: Self-pay

## 2022-10-25 IMAGING — MG MM DIGITAL SCREENING BILAT W/ TOMO AND CAD
8 series · 8 of 24 positions shown · non-contrast
Comparison: Previous exam(s).

CLINICAL DATA: Screening.

EXAM:
DIGITAL SCREENING BILATERAL MAMMOGRAM WITH TOMOSYNTHESIS AND CAD
TECHNIQUE: Bilateral screening digital craniocaudal and mediolateral oblique
mammograms were obtained. Bilateral screening digital breast
tomosynthesis was performed. The images were evaluated with
computer-aided detection.

[L CC synth-2D]
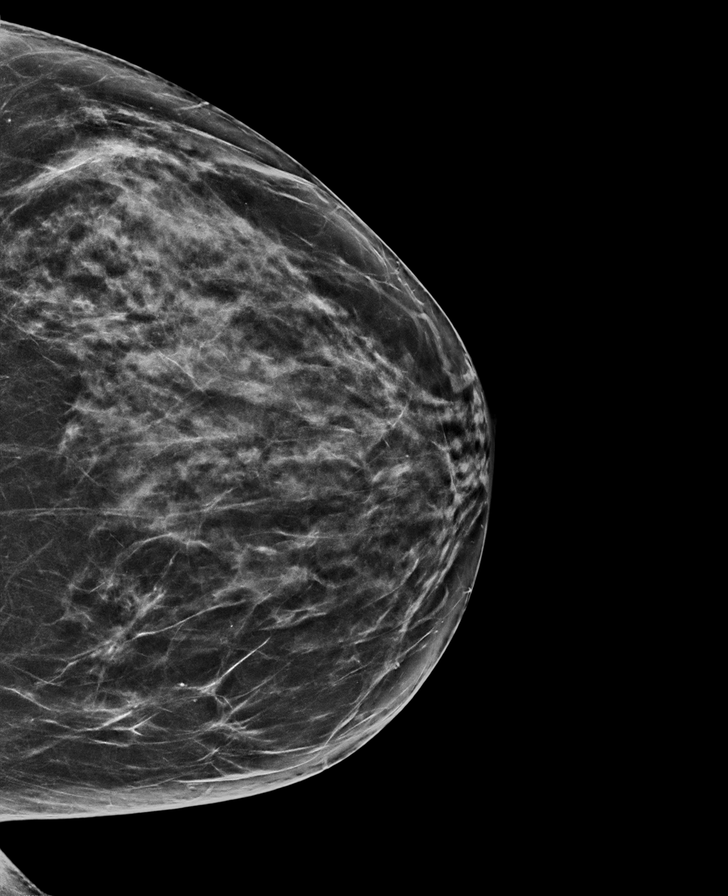

[L MLO synth-2D]
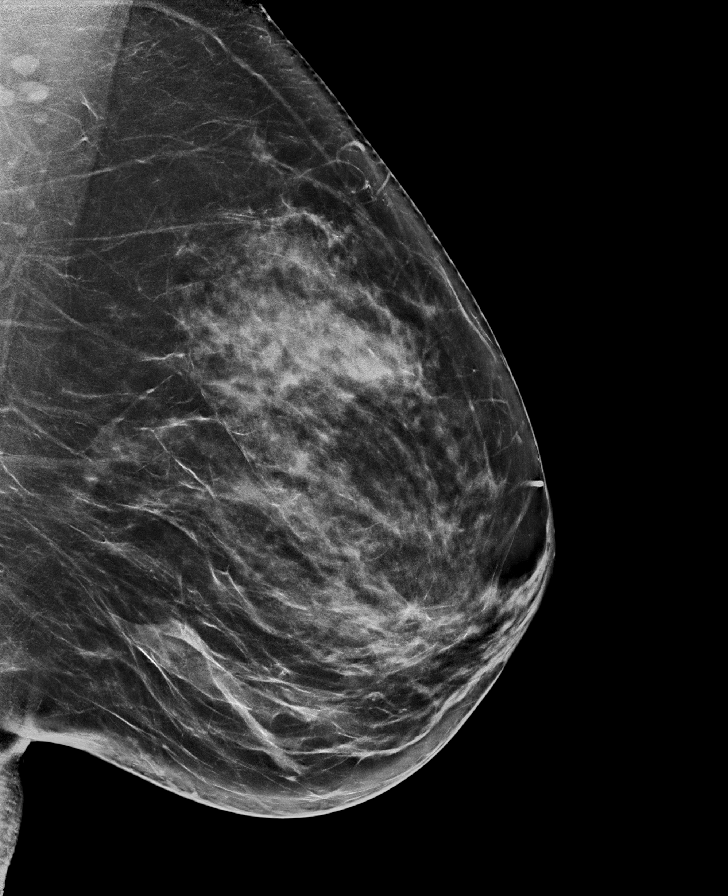

[R CC synth-2D]
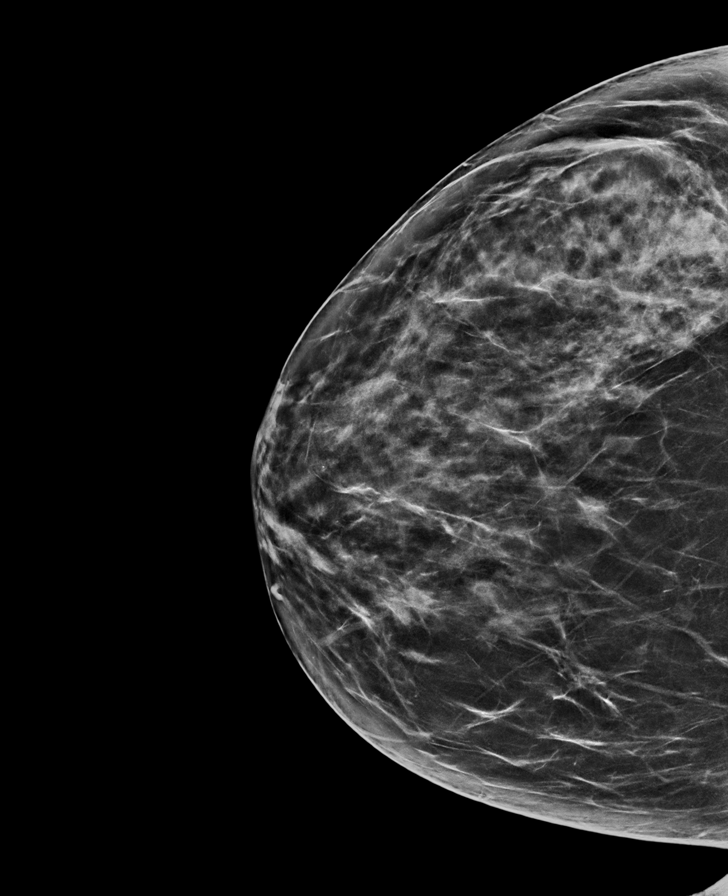

[R MLO synth-2D]
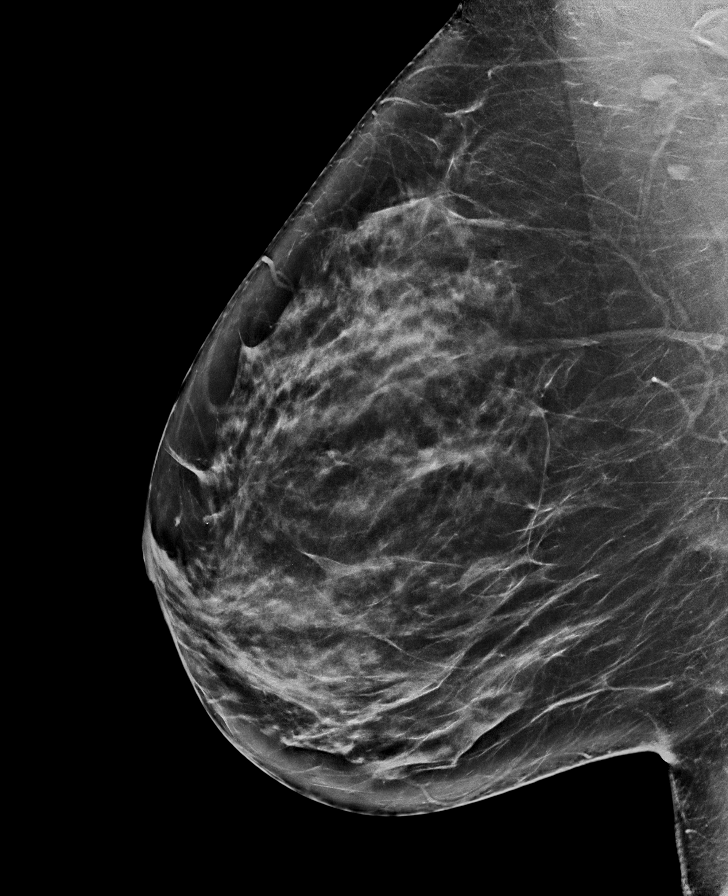

[R CC tomo · tomo slice 39/78.0]
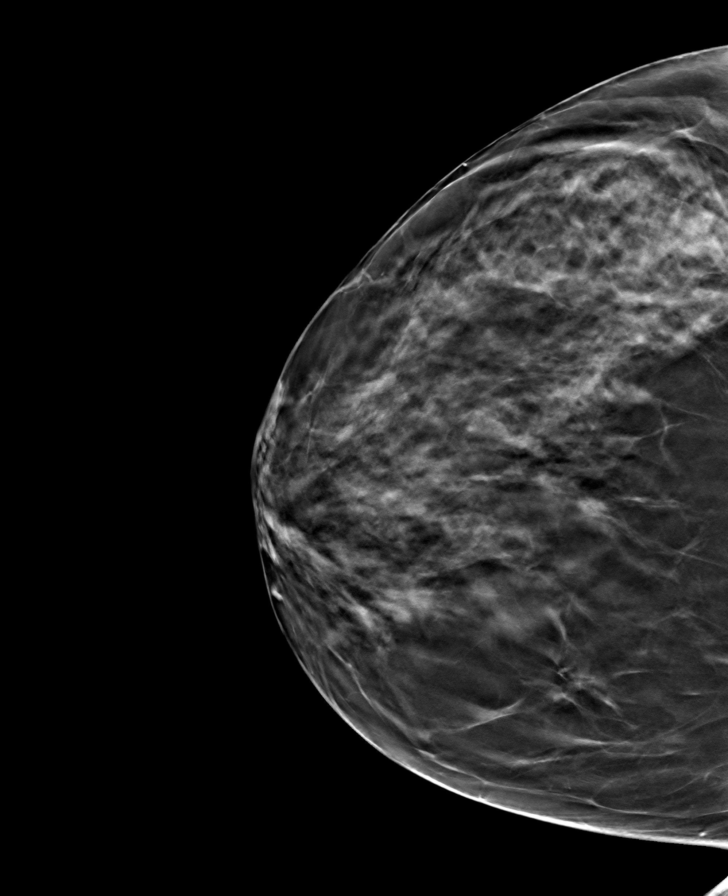

[R MLO tomo · tomo slice 45/88.0]
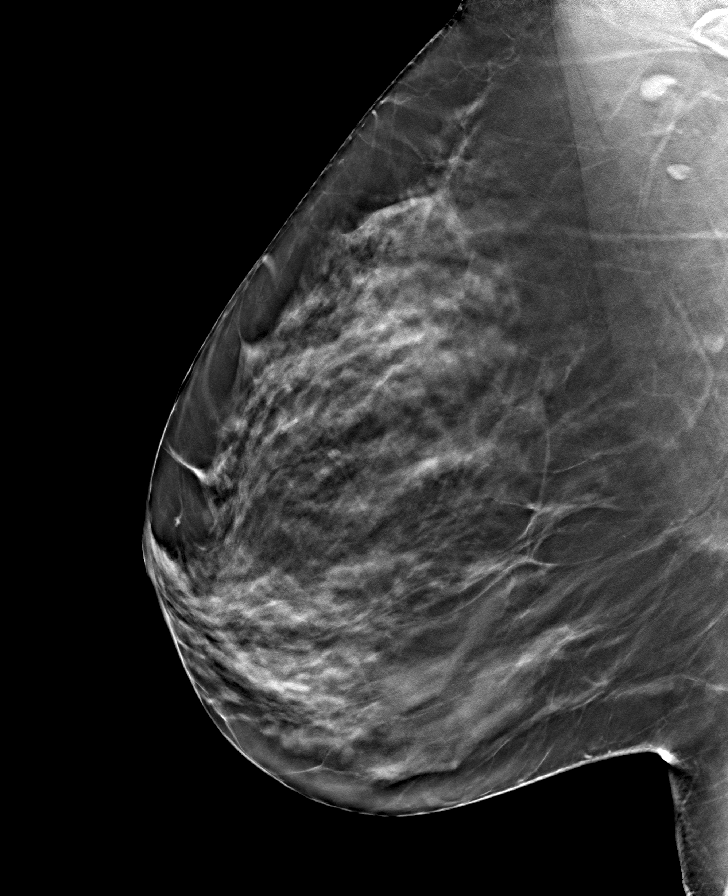

[L MLO tomo · tomo slice 43/85.0]
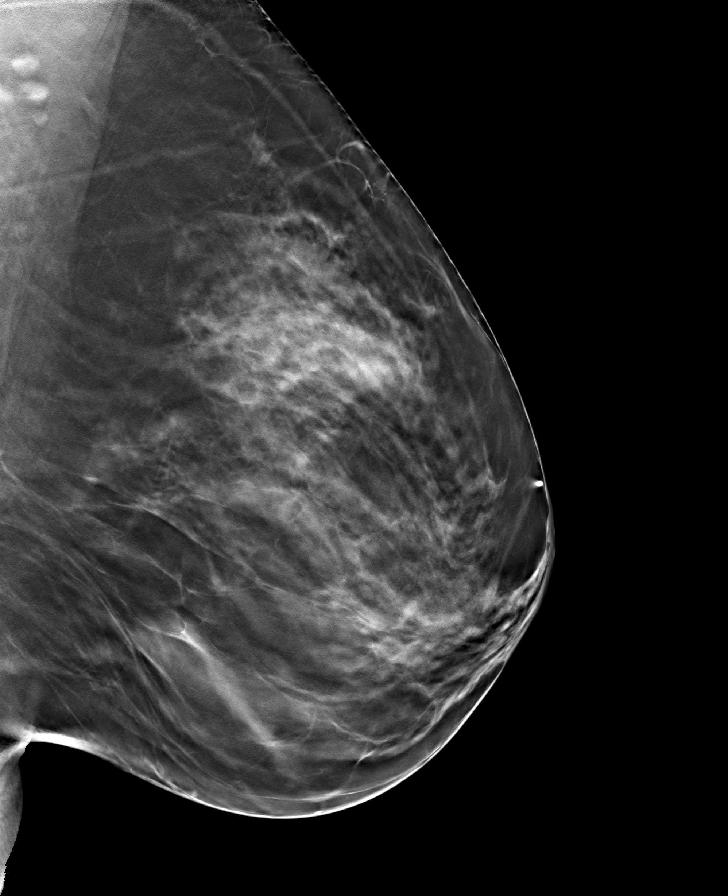

[L CC tomo · tomo slice 38/75.0]
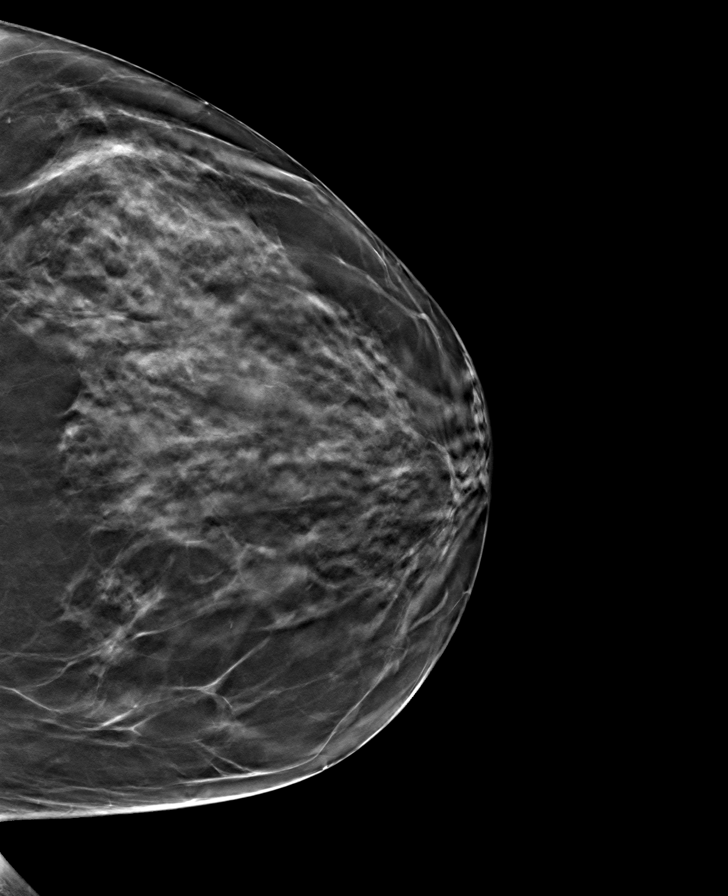

[8 of 24 positions shown; findings below may reference images not displayed]

ACR Breast Density Category c: The breast tissue is heterogeneously
dense, which may obscure small masses.
FINDINGS: In the right breast a possible mass requires further evaluation.

In the left breast a possible mass requires further evaluation.
IMPRESSION: Further evaluation is suggested for possible mass in the right
breast.

Further evaluation is suggested for possible mass in the left
breast.

RECOMMENDATION:
Diagnostic mammogram and possibly ultrasound of both breasts.
(Code:[id number])

The patient will be contacted regarding the findings, and additional
imaging will be scheduled.

BI-RADS CATEGORY  0: Incomplete. Need additional imaging evaluation
and/or prior mammograms for comparison.

## 2023-03-29 ENCOUNTER — Encounter: Payer: Self-pay | Admitting: Obstetrics and Gynecology

## 2023-03-29 DIAGNOSIS — Z1231 Encounter for screening mammogram for malignant neoplasm of breast: Secondary | ICD-10-CM

## 2023-03-29 DIAGNOSIS — Z0000 Encounter for general adult medical examination without abnormal findings: Secondary | ICD-10-CM

## 2023-03-29 DIAGNOSIS — E782 Mixed hyperlipidemia: Secondary | ICD-10-CM

## 2023-04-04 ENCOUNTER — Other Ambulatory Visit: Payer: Self-pay | Admitting: Family Medicine

## 2023-04-04 ENCOUNTER — Other Ambulatory Visit (HOSPITAL_COMMUNITY): Payer: Self-pay | Admitting: Obstetrics and Gynecology

## 2023-04-04 DIAGNOSIS — Z1231 Encounter for screening mammogram for malignant neoplasm of breast: Secondary | ICD-10-CM

## 2023-04-05 ENCOUNTER — Inpatient Hospital Stay (HOSPITAL_COMMUNITY): Admission: RE | Admit: 2023-04-05 | Payer: 59 | Source: Ambulatory Visit

## 2023-04-05 ENCOUNTER — Encounter (HOSPITAL_COMMUNITY): Payer: Self-pay

## 2023-04-05 ENCOUNTER — Ambulatory Visit (HOSPITAL_COMMUNITY)
Admission: RE | Admit: 2023-04-05 | Discharge: 2023-04-05 | Disposition: A | Discharge status: Home / Self Care | Payer: 59 | Source: Ambulatory Visit | Attending: Obstetrics and Gynecology | Admitting: Obstetrics and Gynecology

## 2023-04-05 DIAGNOSIS — Z1231 Encounter for screening mammogram for malignant neoplasm of breast: Secondary | ICD-10-CM | POA: Insufficient documentation

## 2023-04-11 ENCOUNTER — Ambulatory Visit: Payer: Commercial Managed Care - PPO | Admitting: Family Medicine

## 2023-04-11 NOTE — Patient Instructions (Signed)
        Great to see you today.  I have refilled the medication(s) we provide.   Follow up via mychart in 1 week with at home blood pressure readings.   If labs were collected, we will inform you of lab results once received either by echart message or telephone call.   - echart message- for normal results that have been seen by the patient already.   - telephone call: abnormal results or if patient has not viewed results in their echart.   - Please take medications as prescribed. - Follow up with your primary health provider if any health concerns arises. - If symptoms worsen please contact your primary care provider and/or visit the emergency department.

## 2023-04-11 NOTE — Progress Notes (Unsigned)
   New Patient Office Visit   Subjective   Patient ID: Lynn Holt, female    DOB: [birth year]  Age: 47 y.o. MRN: [id number]  CC: No chief complaint on file.   HPI Lynn Holt 47 year old female, presents to establish care. She  has a past medical history of Endometriosis, Family history of adverse reaction to anesthesia, Fatigue (03/28/2021), Feeling bilious (03/04/2009), Hay fever (09/05/2008), Headache, PONV (postoperative nausea and vomiting), and Tachycardia (03/28/2021).  HPI    Outpatient Encounter Medications as of 04/12/2023  Medication Sig   fluticasone (FLONASE) 50 MCG/ACT nasal spray Place 2 sprays into both nostrils daily.   medroxyPROGESTERone Acetate 150 MG/ML SUSY INJECT 1 ML AS DIRECTED EVERY 3 MONTHS.   Multiple Vitamin (MULTIVITAMIN) capsule Take 1 capsule by mouth daily.   No facility-administered encounter medications on file as of 04/12/2023.    Past Surgical History:  Procedure Laterality Date   APPENDECTOMY     COLONOSCOPY WITH PROPOFOL N/A 04/26/2021   Procedure: COLONOSCOPY WITH PROPOFOL;  Surgeon: Toney Reil, MD;  Location: [facility] ENDOSCOPY;  Service: Gastroenterology;  Laterality: N/A;   ESOPHAGOGASTRODUODENOSCOPY N/A 04/26/2021   Procedure: ESOPHAGOGASTRODUODENOSCOPY (EGD);  Surgeon: Toney Reil, MD;  Location: [facility] ENDOSCOPY;  Service: Gastroenterology;  Laterality: N/A;   labrium repair     OVARIAN CYST REMOVAL     SHOULDER SURGERY     TONSILLECTOMY     ULNAR NERVE TRANSPOSITION Right 08/27/2017   Procedure: RIGHT ULNAR NERVE TRANSPOSITION AT THE ELBOW;  Surgeon: Mack Hook, MD;  Location:  SURGERY CENTER;  Service: Orthopedics;  Laterality: Right;    ROS    Objective    There were no vitals taken for this visit.  Physical Exam    Assessment & Plan:  There are no diagnoses linked to this encounter.  No follow-ups on file.   Cruzita Lederer Newman Nip, FNP

## 2023-04-12 ENCOUNTER — Encounter: Payer: Self-pay | Admitting: Family Medicine

## 2023-04-12 ENCOUNTER — Ambulatory Visit: Payer: 59 | Admitting: Family Medicine

## 2023-04-12 VITALS — BP 165/106 | HR 86 | Ht 62.0 in | Wt 160.1 lb

## 2023-04-12 DIAGNOSIS — E559 Vitamin D deficiency, unspecified: Secondary | ICD-10-CM

## 2023-04-12 DIAGNOSIS — Z1159 Encounter for screening for other viral diseases: Secondary | ICD-10-CM

## 2023-04-12 DIAGNOSIS — Z0001 Encounter for general adult medical examination with abnormal findings: Secondary | ICD-10-CM | POA: Diagnosis not present

## 2023-04-12 DIAGNOSIS — E038 Other specified hypothyroidism: Secondary | ICD-10-CM

## 2023-04-12 DIAGNOSIS — Z136 Encounter for screening for cardiovascular disorders: Secondary | ICD-10-CM

## 2023-04-12 DIAGNOSIS — R030 Elevated blood-pressure reading, without diagnosis of hypertension: Secondary | ICD-10-CM

## 2023-04-12 DIAGNOSIS — Z23 Encounter for immunization: Secondary | ICD-10-CM | POA: Diagnosis not present

## 2023-04-12 DIAGNOSIS — Z114 Encounter for screening for human immunodeficiency virus [HIV]: Secondary | ICD-10-CM

## 2023-04-12 DIAGNOSIS — R7301 Impaired fasting glucose: Secondary | ICD-10-CM | POA: Diagnosis not present

## 2023-04-12 DIAGNOSIS — D509 Iron deficiency anemia, unspecified: Secondary | ICD-10-CM

## 2023-04-12 NOTE — Assessment & Plan Note (Signed)

## 2023-04-12 NOTE — Progress Notes (Signed)
Complete physical exam  Patient: Lynn Holt   DOB: [birth year]   47 y.o. Female  MRN: [id number]  Subjective:    Chief Complaint  Patient presents with   Establish Care    CPE/Labs    Lynn Holt is a 47 y.o. female who presents today for a complete physical exam. She reports consuming a  fresh fruits and lean protein  diet. Gym/ health club routine includes treadmill. She generally feels well. She reports sleeping well. She does have additional problems to discuss today.    Most recent fall risk assessment:    05/03/2021   10:05 AM  Fall Risk   Falls in the past year? 0     Most recent depression screenings:    04/12/2023    4:54 PM 09/20/2020    2:35 PM  PHQ 2/9 Scores  PHQ - 2 Score 0 1  PHQ- 9 Score  8    Vision:Within last year and Dental: No current dental problems and Receives regular dental care  Patient Care Team: Del Newman Nip, Tenna Child, FNP as PCP - General (Family Medicine)   Outpatient Medications Prior to Visit  Medication Sig   fluticasone (FLONASE) 50 MCG/ACT nasal spray Place 2 sprays into both nostrils daily.   medroxyPROGESTERone Acetate 150 MG/ML SUSY INJECT 1 ML AS DIRECTED EVERY 3 MONTHS.   Multiple Vitamin (MULTIVITAMIN) capsule Take 1 capsule by mouth daily.   No facility-administered medications prior to visit.    Review of Systems  Constitutional:  Negative for chills and fever.  Eyes:  Negative for blurred vision.  Respiratory:  Negative for shortness of breath.   Cardiovascular:  Negative for chest pain.  Gastrointestinal:  Negative for abdominal pain.  Genitourinary:  Negative for dysuria.  Musculoskeletal:  Negative for myalgias.  Neurological:  Negative for dizziness.       Objective:    BP (!) 165/106   Pulse 86   Ht 5\' 2"  (1.575 m)   Wt 160 lb 1.3 oz (72.6 kg)   LMP 03/27/2023   SpO2 96%   BMI 29.28 kg/m  BP Readings from Last 3 Encounters:  04/12/23 (!) 165/106  09/13/21 114/80  05/25/21 (!) 142/100       Physical Exam Vitals reviewed.  Constitutional:      General: She is not in acute distress.    Appearance: Normal appearance. She is not ill-appearing, toxic-appearing or diaphoretic.  HENT:     Head: Normocephalic.     Right Ear: Tympanic membrane normal.     Left Ear: Tympanic membrane normal.     Nose: Nose normal. No congestion.     Mouth/Throat:     Mouth: Mucous membranes are moist.  Eyes:     General:        Right eye: No discharge.        Left eye: No discharge.     Conjunctiva/sclera: Conjunctivae normal.     Pupils: Pupils are equal, round, and reactive to light.  Cardiovascular:     Rate and Rhythm: Normal rate.     Pulses: Normal pulses.     Heart sounds: Normal heart sounds.  Pulmonary:     Effort: Pulmonary effort is normal. No respiratory distress.     Breath sounds: Normal breath sounds.  Abdominal:     General: Bowel sounds are normal.     Palpations: Abdomen is soft.     Tenderness: There is no abdominal tenderness. There is no right CVA tenderness, left CVA  tenderness or guarding.  Musculoskeletal:        General: Normal range of motion.     Cervical back: Normal range of motion.  Skin:    General: Skin is warm and dry.     Capillary Refill: Capillary refill takes less than 2 seconds.  Neurological:     Mental Status: She is alert.     Coordination: Coordination normal.     Gait: Gait normal.  Psychiatric:        Mood and Affect: Mood normal.      No results found for any visits on 04/12/23.    Assessment & Plan:    Routine Health Maintenance and Physical Exam  Immunization History  Administered Date(s) Administered   Influenza, Seasonal, Injecte, Preservative Fre 04/12/2023   Influenza,inj,Quad PF,6+ Mos 01/30/2019   Influenza-Unspecified 02/23/2017, 02/27/2020   Td 05/29/2001    Health Maintenance  Topic Date Due   HIV Screening  Never done   Hepatitis C Screening  Never done   COVID-19 Vaccine (1 - 2023-24 season) 04/28/2023  (Originally 01/28/2023)   DTaP/Tdap/Td (2 - Tdap) 04/11/2024 (Originally 05/30/2011)   Cervical Cancer Screening (HPV/Pap Cotest)  07/12/2025   Colonoscopy  04/26/2026   INFLUENZA VACCINE  Completed   HPV VACCINES  Aged Out    Discussed health benefits of physical activity, and encouraged her to engage in regular exercise appropriate for her age and condition.  Encounter for screening for cardiovascular disorders -     Microalbumin / creatinine urine ratio -     Lipid panel -     CMP14+EGFR -     CBC with Differential/Platelet  IFG (impaired fasting glucose) -     Hemoglobin A1c  TSH (thyroid-stimulating hormone deficiency) -     TSH + free T4  Screening for HIV (human immunodeficiency virus) -     HIV Antibody (routine testing w rflx)  Vitamin D deficiency -     VITAMIN D 25 Hydroxy (Vit-D Deficiency, Fractures)  Need for hepatitis C screening test -     Hepatitis C antibody  Encounter for immunization -     Flu vaccine trivalent PF, 6mos and older(Flulaval,Afluria,Fluarix,Fluzone)  Iron deficiency anemia, unspecified iron deficiency anemia type -     Iron, TIBC and Ferritin Panel -     Vitamin B12  Encounter for routine adult physical exam with abnormal findings Assessment & Plan: A comprehensive physical examination was completed, and necessary labs were ordered. Screening and health maintenance recommendations have been updated. The patient received counseling on exercise and nutrition. BMI was assessed and discussed Advise for heart health, focus on: Eat more fruits and vegetables: Aim for a variety of colors. Choose whole grains: Brown rice, oats, and whole-wheat bread. Limit unhealthy fats: Avoid trans fats; use olive or avocado oil instead. Include lean proteins: Opt for fish, chicken, beans, and legumes. Reduce sodium: Limit processed foods and add less salt. Stay hydrated: Drink plenty of water. Exercise regularly: Aim for at least 30 minutes of moderate  exercise, like walking or cycling, 5 days a week.     Elevated blood pressure reading Assessment & Plan: Vitals:   04/12/23 1641 04/12/23 1650  BP: (!) 172/122 (!) 165/106  Follow up in 1 week via my chart with at home blood pressure readings to monitor trends   Discussed with  patient to monitor their blood pressure regularly and maintain a heart-healthy diet rich in fruits, vegetables, whole grains, and low-fat dairy, while reducing sodium intake to less  than 2,300 mg per day. Regular physical activity, such as 30 minutes of moderate exercise most days of the week, will help lower blood pressure and improve overall cardiovascular health. Avoiding smoking, limiting alcohol consumption, and managing stress. Take  prescribed medication, & take it as directed and avoid skipping doses. Seek emergency care if your blood pressure is (over 180/100) or you experience chest pain, shortness of breath, or sudden vision changes.Patient verbalizes understanding regarding plan of care and all questions answered.      Return in 1 year (on 04/11/2024), or if symptoms worsen or fail to improve, for routine labs.     Cruzita Lederer Newman Nip, FNP

## 2023-04-12 NOTE — Assessment & Plan Note (Signed)
Vitals:   04/12/23 1641 04/12/23 1650  BP: (!) 172/122 (!) 165/106  Follow up in 1 week via my chart with at home blood pressure readings to monitor trends   Discussed with  patient to monitor their blood pressure regularly and maintain a heart-healthy diet rich in fruits, vegetables, whole grains, and low-fat dairy, while reducing sodium intake to less than 2,300 mg per day. Regular physical activity, such as 30 minutes of moderate exercise most days of the week, will help lower blood pressure and improve overall cardiovascular health. Avoiding smoking, limiting alcohol consumption, and managing stress. Take  prescribed medication, & take it as directed and avoid skipping doses. Seek emergency care if your blood pressure is (over 180/100) or you experience chest pain, shortness of breath, or sudden vision changes.Patient verbalizes understanding regarding plan of care and all questions answered.

## 2023-04-13 ENCOUNTER — Other Ambulatory Visit: Payer: 59

## 2023-04-15 LAB — CBC WITH DIFFERENTIAL/PLATELET
Basophils Absolute: 0.1 10*3/uL (ref 0.0–0.2)
Basos: 1 %
EOS (ABSOLUTE): 0.2 10*3/uL (ref 0.0–0.4)
Eos: 2 %
Hematocrit: 42.5 % (ref 34.0–46.6)
Hemoglobin: 14.4 g/dL (ref 11.1–15.9)
Immature Grans (Abs): 0 10*3/uL (ref 0.0–0.1)
Immature Granulocytes: 0 %
Lymphocytes Absolute: 1.7 10*3/uL (ref 0.7–3.1)
Lymphs: 20 %
MCH: 30.2 pg (ref 26.6–33.0)
MCHC: 33.9 g/dL (ref 31.5–35.7)
MCV: 89 fL (ref 79–97)
Monocytes Absolute: 0.8 10*3/uL (ref 0.1–0.9)
Monocytes: 9 %
Neutrophils Absolute: 5.9 10*3/uL (ref 1.4–7.0)
Neutrophils: 68 %
Platelets: 244 10*3/uL (ref 150–450)
RBC: 4.77 x10E6/uL (ref 3.77–5.28)
RDW: 12.8 % (ref 11.7–15.4)
WBC: 8.6 10*3/uL (ref 3.4–10.8)

## 2023-04-15 LAB — TSH+FREE T4
Free T4: 1.3 ng/dL (ref 0.82–1.77)
TSH: 0.923 u[IU]/mL (ref 0.450–4.500)

## 2023-04-15 LAB — IRON,TIBC AND FERRITIN PANEL
Ferritin: 91 ng/mL (ref 15–150)
Iron Saturation: 22 % (ref 15–55)
Iron: 76 ug/dL (ref 27–159)
Total Iron Binding Capacity: 345 ug/dL (ref 250–450)
UIBC: 269 ug/dL (ref 131–425)

## 2023-04-15 LAB — CMP14+EGFR
ALT: 28 [IU]/L (ref 0–32)
AST: 27 [IU]/L (ref 0–40)
Albumin: 4.6 g/dL (ref 3.9–4.9)
Alkaline Phosphatase: 79 [IU]/L (ref 44–121)
BUN/Creatinine Ratio: 11 (ref 9–23)
BUN: 9 mg/dL (ref 6–24)
Bilirubin Total: 0.5 mg/dL (ref 0.0–1.2)
CO2: 20 mmol/L (ref 20–29)
Calcium: 9.5 mg/dL (ref 8.7–10.2)
Chloride: 104 mmol/L (ref 96–106)
Creatinine, Ser: 0.85 mg/dL (ref 0.57–1.00)
Globulin, Total: 2.3 g/dL (ref 1.5–4.5)
Glucose: 88 mg/dL (ref 70–99)
Potassium: 4.9 mmol/L (ref 3.5–5.2)
Sodium: 141 mmol/L (ref 134–144)
Total Protein: 6.9 g/dL (ref 6.0–8.5)
eGFR: 85 mL/min/{1.73_m2} (ref >59)

## 2023-04-15 LAB — MICROALBUMIN / CREATININE URINE RATIO
Creatinine, Urine: 77 mg/dL
Microalb/Creat Ratio: 6 mg/g{creat} (ref 0–29)
Microalbumin, Urine: 4.8 ug/mL

## 2023-04-15 LAB — LIPID PANEL
Chol/HDL Ratio: 3.9 ratio (ref 0.0–4.4)
Cholesterol, Total: 301 mg/dL — ABNORMAL HIGH (ref 100–199)
HDL: 77 mg/dL (ref >39)
LDL Chol Calc (NIH): 191 mg/dL — ABNORMAL HIGH (ref 0–99)
Triglycerides: 179 mg/dL — ABNORMAL HIGH (ref 0–149)
VLDL Cholesterol Cal: 33 mg/dL (ref 5–40)

## 2023-04-15 LAB — HEMOGLOBIN A1C
Est. average glucose Bld gHb Est-mCnc: 108 mg/dL
Hgb A1c MFr Bld: 5.4 % (ref 4.8–5.6)

## 2023-04-15 LAB — VITAMIN B12: Vitamin B-12: 971 pg/mL (ref 232–1245)

## 2023-04-15 LAB — VITAMIN D 25 HYDROXY (VIT D DEFICIENCY, FRACTURES): Vit D, 25-Hydroxy: 62.2 ng/mL (ref 30.0–100.0)

## 2023-04-16 ENCOUNTER — Encounter: Payer: Self-pay | Admitting: Family Medicine

## 2023-04-17 ENCOUNTER — Ambulatory Visit: Payer: Commercial Managed Care - PPO | Admitting: Family Medicine

## 2023-04-20 ENCOUNTER — Encounter: Payer: Self-pay | Admitting: Family Medicine

## 2023-04-20 ENCOUNTER — Encounter: Payer: Self-pay | Admitting: Obstetrics and Gynecology

## 2023-04-20 DIAGNOSIS — Z130 Encounter for screening for diseases of the blood and blood-forming organs and certain disorders involving the immune mechanism: Secondary | ICD-10-CM

## 2023-04-20 NOTE — Telephone Encounter (Signed)
Blood pressure stable keep up the good work

## 2023-04-30 ENCOUNTER — Other Ambulatory Visit: Payer: 59

## 2023-04-30 DIAGNOSIS — Z130 Encounter for screening for diseases of the blood and blood-forming organs and certain disorders involving the immune mechanism: Secondary | ICD-10-CM

## 2023-05-01 LAB — PROTIME-INR
INR: 0.9 (ref 0.9–1.2)
Prothrombin Time: 10.3 s (ref 9.1–12.0)

## 2023-05-01 LAB — APTT: aPTT: 28 s (ref 24–33)

## 2023-05-09 HISTORY — PX: BREAST REDUCTION SURGERY: SHX8

## 2023-05-14 NOTE — Progress Notes (Unsigned)
PCP: Rica Records, FNP   No chief complaint on file.   HPI:      [name] is a 47 y.o. G0P0000 whose LMP was No LMP recorded. Patient has had an injection., presents today for her annual examination.  Her menses are absent due to depo for endometriosis; has rare BTB/no dysmen. Starting to have some occas hot flashes.    Sex activity: single partner, contraception - Depo-Provera injections. She does not have vaginal dryness.  Last Pap: 07/12/20  Results were: no abnormalities /neg HPV DNA.   Last mammogram: 04/05/23 Results were: normal with bilat breast cysts--routine follow-up in 12 months There is a FH of breast cancer in her MGM and mat aunt, genetic testing not indicated. There is no FH of ovarian cancer. The patient does do self-breast exams.  Colonoscopy: 11/22 with Dr. Allegra Lai with polyps; Repeat due after 5 years.   Tobacco use: The patient denies current or previous tobacco use. Alcohol use: social drinker No drug use Exercise: moderately active  She does get adequate calcium and Vitamin D in her diet.  Labs 2/22 were normal except hyperlipidemia. Due for repeat labs this yr Elevated on 11/24 labs with PCP????   Patient Active Problem List   Diagnosis Date Noted   Encounter for routine adult physical exam with abnormal findings 04/12/2023   Elevated blood pressure reading 04/12/2023   Elevated lipids 09/13/2021   Chronic cholecystitis 05/03/2021   History of adenomatous polyp of colon    Migraine 03/28/2021   Last menstrual period (LMP) < 10 days ago 03/28/2021   Vitamin D deficiency 09/20/2020   Esophagitis, reflux 04/02/2009   Family history of cardiovascular disease 09/01/2008    Past Surgical History:  Procedure Laterality Date   APPENDECTOMY     COLONOSCOPY WITH PROPOFOL N/A 04/26/2021   Procedure: COLONOSCOPY WITH PROPOFOL;  Surgeon: Toney Reil, MD;  Location: [facility] ENDOSCOPY;  Service: Gastroenterology;  Laterality: N/A;    ESOPHAGOGASTRODUODENOSCOPY N/A 04/26/2021   Procedure: ESOPHAGOGASTRODUODENOSCOPY (EGD);  Surgeon: Toney Reil, MD;  Location: [facility];  Service: Gastroenterology;  Laterality: N/A;   labrium repair     OVARIAN CYST REMOVAL     SHOULDER SURGERY     TONSILLECTOMY     ULNAR NERVE TRANSPOSITION Right 08/27/2017   Procedure: RIGHT ULNAR NERVE TRANSPOSITION AT THE ELBOW;  Surgeon: Mack Hook, MD;  Location: [facility];  Service: Orthopedics;  Laterality: Right;    Family History  Problem Relation Age of Onset   Fibromyalgia Mother    Rheum arthritis Father    Heart attack Father    Breast cancer Maternal Aunt 96   Breast cancer Maternal Grandmother 24   Colon cancer Maternal Grandmother    Lung cancer Maternal Grandmother    Heart attack Paternal Grandfather    Esophageal cancer Paternal Grandfather    Neuropathy Neg Hx     Social History   Socioeconomic History   Marital status: Married    Spouse name: Casimiro Needle   Number of children: 0   Years of education: Masters    [employer] education level: Master's degree (e.g., MA, MS, MEng, MEd, MSW, MBA)  Occupational History   Occupation: Building surveyor  Tobacco Use   Smoking status: Never   Smokeless tobacco: Never  Vaping Use   Vaping status: Never Used  Substance and Sexual Activity   Alcohol use: Yes    Comment: occas   Drug use: No   Sexual activity:  Yes    Birth control/protection: None  Other Topics Concern   Not on file  Social History Narrative   Drinks 1 caffeine drink a day    Social Drivers of Corporate investment banker Strain: Low Risk  (04/11/2023)   Overall Financial Resource Strain (CARDIA)    Difficulty of Paying Living Expenses: Not hard at all  Food Insecurity: No Food Insecurity (04/11/2023)   Hunger Vital Sign    Worried About Running Out of Food in the Last Year: Never true    Ran Out of Food in the Last Year: Never true  Transportation Needs: No Transportation  Needs (04/11/2023)   PRAPARE - Administrator, Civil Service (Medical): No    Lack of Transportation (Non-Medical): No  Physical Activity: Sufficiently Active (04/11/2023)   Exercise Vital Sign    Days of Exercise per Week: 4 days    Minutes of Exercise per Session: 60 min  Stress: No Stress Concern Present (04/11/2023)   [employer] of Occupational Health - Occupational Stress Questionnaire    Feeling of Stress : Only a little  Social Connections: Socially Integrated (04/11/2023)   Social Connection and Isolation Panel [NHANES]    Frequency of Communication with Friends and Family: More than three times a week    Frequency of Social Gatherings with Friends and Family: Once a week    Attends Religious Services: More than 4 times per year    Active Member of [employer] or Organizations: Yes    Attends Engineer, structural: More than 4 times per year    Marital Status: Married  Catering manager Violence: Not on file     Current Outpatient Medications:    fluticasone (FLONASE) 50 MCG/ACT nasal spray, Place 2 sprays into both nostrils daily., Disp: 16 g, Rfl: 0   medroxyPROGESTERone Acetate 150 MG/ML SUSY, INJECT 1 ML AS DIRECTED EVERY 3 MONTHS., Disp: 1 mL, Rfl: 3   Multiple Vitamin (MULTIVITAMIN) capsule, Take 1 capsule by mouth daily., Disp: , Rfl:      ROS:  Review of Systems  Constitutional:  Negative for fatigue, fever and unexpected weight change.  Respiratory:  Negative for cough, shortness of breath and wheezing.   Cardiovascular:  Negative for chest pain, palpitations and leg swelling.  Gastrointestinal:  Negative for blood in stool, constipation, diarrhea, nausea and vomiting.  Endocrine: Negative for cold intolerance, heat intolerance and polyuria.  Genitourinary:  Negative for dyspareunia, dysuria, flank pain, frequency, genital sores, hematuria, menstrual problem, pelvic pain, urgency, vaginal bleeding, vaginal discharge and vaginal pain.   Musculoskeletal:  Positive for arthralgias. Negative for back pain, joint swelling and myalgias.  Skin:  Negative for rash.  Neurological:  Negative for dizziness, syncope, light-headedness, numbness and headaches.  Hematological:  Negative for adenopathy.  Psychiatric/Behavioral:  Positive for agitation. Negative for confusion, sleep disturbance and suicidal ideas. The patient is not nervous/anxious.    BREAST: No symptoms    Objective: There were no vitals taken for this visit.   Physical Exam Constitutional:      Appearance: She is well-developed.  Genitourinary:     Vulva normal.     Right Labia: No rash, tenderness or lesions.    Left Labia: No tenderness, lesions or rash.    No vaginal discharge, erythema or tenderness.      Right Adnexa: not tender and no mass present.    Left Adnexa: not tender and no mass present.    No cervical friability or polyp.  Uterus is not enlarged or tender.  Breasts:    Right: No mass, nipple discharge, skin change or tenderness.     Left: No mass, nipple discharge, skin change or tenderness.  Neck:     Thyroid: No thyromegaly.  Cardiovascular:     Rate and Rhythm: Normal rate and regular rhythm.     Heart sounds: Normal heart sounds. No murmur heard. Pulmonary:     Effort: Pulmonary effort is normal.     Breath sounds: Normal breath sounds.  Abdominal:     Palpations: Abdomen is soft.     Tenderness: There is no abdominal tenderness. There is no guarding or rebound.  Musculoskeletal:        General: Normal range of motion.     Cervical back: Normal range of motion.  Lymphadenopathy:     Cervical: No cervical adenopathy.  Neurological:     General: No focal deficit present.     Mental Status: She is alert and oriented to person, place, and time.     Cranial Nerves: No cranial nerve deficit.  Skin:    General: Skin is warm and dry.  Psychiatric:        Mood and Affect: Mood normal.        Behavior: Behavior normal.         Thought Content: Thought content normal.        Judgment: Judgment normal.  Vitals reviewed.     Assessment/Plan:  Encounter for annual routine gynecological examination  Encounter for surveillance of injectable contraceptive - Plan: medroxyPROGESTERone Acetate 150 MG/ML SUSY; Depo RF eRxd. Cont ca/Vit D  Encounter for screening mammogram for malignant neoplasm of breast - Plan: MM 3D SCREEN BREAST BILATERAL; pt to schedule mammo  Family history of breast cancer--genetic testing not indicated currently but will cont to follow FH  Elevated lipids - Plan: Lipid panel; repeat labs. Will f/u with results  Blood tests for routine general physical examination - Plan: Lipid panel, Comprehensive metabolic panel   No orders of the defined types were placed in this encounter.           GYN counsel breast self exam, mammography screening, adequate intake of calcium and vitamin D, diet and exercise    F/U  No follow-ups on file.  Angelee Bahr B. Demaurion Dicioccio, PA-C 05/14/2023 1:58 PM

## 2023-05-17 ENCOUNTER — Ambulatory Visit: Payer: 59 | Admitting: Obstetrics and Gynecology

## 2023-05-17 ENCOUNTER — Encounter: Payer: Self-pay | Admitting: Obstetrics and Gynecology

## 2023-05-17 VITALS — BP 137/78 | HR 80 | Ht 62.0 in | Wt 182.0 lb

## 2023-05-17 DIAGNOSIS — Z3042 Encounter for surveillance of injectable contraceptive: Secondary | ICD-10-CM

## 2023-05-17 DIAGNOSIS — Z01419 Encounter for gynecological examination (general) (routine) without abnormal findings: Secondary | ICD-10-CM | POA: Diagnosis not present

## 2023-05-17 DIAGNOSIS — E782 Mixed hyperlipidemia: Secondary | ICD-10-CM

## 2023-05-17 DIAGNOSIS — Z803 Family history of malignant neoplasm of breast: Secondary | ICD-10-CM

## 2023-05-17 DIAGNOSIS — Z1231 Encounter for screening mammogram for malignant neoplasm of breast: Secondary | ICD-10-CM

## 2023-05-17 MED ORDER — MEDROXYPROGESTERONE ACETATE 150 MG/ML IM SUSY: PREFILLED_SYRINGE | INTRAMUSCULAR | 3 refills | Status: DC

## 2023-05-17 NOTE — Patient Instructions (Signed)
I value your feedback and you entrusting us with your care. If you get a [facility] patient survey, I would appreciate you taking the time to let us know about your experience today. Thank you! ? ? ?

## 2023-10-30 ENCOUNTER — Telehealth: Admitting: Physician Assistant

## 2023-10-30 DIAGNOSIS — B9689 Other specified bacterial agents as the cause of diseases classified elsewhere: Secondary | ICD-10-CM | POA: Diagnosis not present

## 2023-10-30 DIAGNOSIS — J019 Acute sinusitis, unspecified: Secondary | ICD-10-CM

## 2023-10-30 MED ORDER — AMOXICILLIN-POT CLAVULANATE 875-125 MG PO TABS: 1.0000 | ORAL_TABLET | Freq: Two times a day (BID) | ORAL | 0 refills | Status: DC

## 2023-10-30 NOTE — Progress Notes (Signed)

## 2024-04-11 ENCOUNTER — Ambulatory Visit: Payer: 59 | Admitting: Family Medicine

## 2024-05-09 ENCOUNTER — Other Ambulatory Visit: Payer: Self-pay | Admitting: Obstetrics and Gynecology

## 2024-05-09 DIAGNOSIS — Z3042 Encounter for surveillance of injectable contraceptive: Secondary | ICD-10-CM

## 2024-06-13 ENCOUNTER — Ambulatory Visit: Admitting: Obstetrics

## 2024-06-13 ENCOUNTER — Encounter: Payer: Self-pay | Admitting: Obstetrics

## 2024-06-13 VITALS — BP 129/85 | HR 94 | Ht 62.0 in | Wt 164.0 lb

## 2024-06-13 DIAGNOSIS — Z01419 Encounter for gynecological examination (general) (routine) without abnormal findings: Secondary | ICD-10-CM | POA: Diagnosis not present

## 2024-06-13 DIAGNOSIS — Z3042 Encounter for surveillance of injectable contraceptive: Secondary | ICD-10-CM

## 2024-06-13 DIAGNOSIS — Z1231 Encounter for screening mammogram for malignant neoplasm of breast: Secondary | ICD-10-CM

## 2024-06-13 MED ORDER — MEDROXYPROGESTERONE ACETATE 150 MG/ML IM SUSY: PREFILLED_SYRINGE | INTRAMUSCULAR | 3 refills | Status: AC

## 2024-06-13 MED ORDER — MEDROXYPROGESTERONE ACETATE 150 MG/ML IM SUSY
150.0000 mg | PREFILLED_SYRINGE | Freq: Once | INTRAMUSCULAR | Status: AC
  Administered 2024-06-13: 150 mg via INTRAMUSCULAR

## 2024-06-13 NOTE — Addendum Note (Signed)
 Addended by: Samanthajo Payano on: 06/13/2024 09:19 AM   Modules accepted: Orders

## 2024-06-13 NOTE — Progress Notes (Signed)
 "  ANNUAL GYNECOLOGICAL EXAM  SUBJECTIVE  HPI  Sharyon A Rosenau is a 49 y.o.-year-old G0P0000 who presents for an annual gynecological exam today.  She denies pelvic pain, dyspareunia, abnormal vaginal bleeding or discharge, and UTI symptoms. She is using Depo for her endometriosis ,and it has been working well. She recently started on an estrogen patch that was prescribed online, and this has significantly reduced her hot flashes.  Medical/Surgical History Past Medical History:  Diagnosis Date   Allergy    Endometriosis    Family history of adverse reaction to anesthesia    nausea, Mom has a hard time waking up, dad has a hard time staying asleep   Fatigue 03/28/2021   Feeling bilious 03/04/2009   GERD (gastroesophageal reflux disease)    Hay fever 09/05/2008   Headache    Hyperlipidemia    PONV (postoperative nausea and vomiting)    Tachycardia 03/28/2021   Past Surgical History:  Procedure Laterality Date   APPENDECTOMY     BREAST REDUCTION SURGERY  05/09/2023   COLONOSCOPY WITH PROPOFOL  N/A 04/26/2021   Procedure: COLONOSCOPY WITH PROPOFOL ;  Surgeon: Unk Corinn Skiff, MD;  Location: [facility];  Service: Gastroenterology;  Laterality: N/A;   ESOPHAGOGASTRODUODENOSCOPY N/A 04/26/2021   Procedure: ESOPHAGOGASTRODUODENOSCOPY (EGD);  Surgeon: Unk Corinn Skiff, MD;  Location[facility];  Service: Gastroenterology;  Laterality: N/A;   labrium repair     OVARIAN CYST REMOVAL     SHOULDER SURGERY     TONSILLECTOMY     ULNAR NERVE TRANSPOSITION Right 08/27/2017   Procedure: RIGHT ULNAR NERVE TRANSPOSITION AT THE ELBOW;  Surgeon: Sebastian Lenis, MD;  Location: [facility];  Service: Orthopedics;  Laterality: Right;    Social History Lives with husband. Feels safe there Work: Exercise: 5-6x/week Substances: Occasional EtOH; denies tobacco, vape, and recreational drugs  Obstetric History OB History     Gravida  0   Para  0   Term  0    Preterm  0   AB  0   Living  0      SAB  0   IAB  0   Ectopic  0   Multiple  0   Live Births  0            GYN/Menstrual History No LMP recorded. Patient has had an injection. Last Pap: 07/12/20 Contraception: Depo  Prevention Mammogram: 11/24 Colonoscopy: up to date Flu shot/vaccines: already received  Current Medications Show/hide medication list[1]      ROS Constitutional: Denied constitutional symptoms, night sweats, recent illness, fatigue, fever, insomnia and weight loss.  Eyes: Denied eye symptoms, eye pain, photophobia, vision change and visual disturbance.  Ears/Nose/Throat/Neck: Denied ear, nose, throat or neck symptoms, hearing loss, nasal discharge, sinus congestion and sore throat.  Cardiovascular: Denied cardiovascular symptoms, arrhythmia, chest pain/pressure, edema, exercise intolerance, orthopnea and palpitations.  Respiratory: Denied pulmonary symptoms, asthma, pleuritic pain, productive sputum, cough, dyspnea and wheezing.  Gastrointestinal: Denied gastro-esophageal reflux, melena, nausea and vomiting.  Genitourinary: Denied genitourinary symptoms including symptomatic vaginal discharge, pelvic relaxation issues, and urinary complaints.  Musculoskeletal: Denied musculoskeletal symptoms, stiffness, swelling, muscle weakness and myalgia.  Dermatologic: Denied dermatology symptoms, rash and scar.  Neurologic: Denied neurology symptoms, dizziness, headache, neck pain and syncope.  Psychiatric: Denied psychiatric symptoms, anxiety and depression.  Endocrine: Denied endocrine symptoms including hot flashes and night sweats.    OBJECTIVE  BP 129/85   Pulse 94   Ht 5' 2 (1.575 m)   Wt 164 lb (  74.4 kg)   BMI 30.00 kg/m    Physical examination General NAD, Conversant  HEENT Atraumatic; Op clear with mmm.  Normo-cephalic. Pupils reactive. Anicteric sclerae  Thyroid /Neck Smooth without nodularity or enlargement. Normal ROM.  Neck Supple.  Skin  No rashes, lesions or ulceration. Normal palpated skin turgor. No nodularity.  Breasts: Declined  Lungs: Clear to auscultation.No rales or wheezes. Normal Respiratory effort, no retractions.  Heart: NSR.  No murmurs or rubs appreciated. No peripheral edema  Abdomen: Soft.  Non-tender.  No masses.  No HSM. No hernia  Extremities: Moves all appropriately.  Normal ROM for age. No lymphadenopathy.  Neuro: Oriented to PPT.  Normal mood. Normal affect.     Pelvic: Declined    ASSESSMENT  1) Annual exam 2) Need for contraception  PLAN 1) Physical exam as noted. Discussed healthy lifestyle choices and preventive care. Routine labs done with PCP. Declines STI testing. Pap due 2027. Mammogram ordered 2) Depo injection given today. Rx sent to pharmacy (self-administers doses). Consider stopping Depo in a few years to evaluate for menopause.  Return in one year for annual exam or as needed for concerns.   Eleanor Canny, CNM      [1]  Outpatient Medications Prior to Visit  Medication Sig   fluticasone  (FLONASE ) 50 MCG/ACT nasal spray Place 2 sprays into both nostrils daily.   medroxyPROGESTERone  Acetate 150 MG/ML SUSY INJECT 1 ML AS DIRECTED EVERY 3 MONTHS.   Multiple Vitamin (MULTIVITAMIN) capsule Take 1 capsule by mouth daily.   [DISCONTINUED] amoxicillin -clavulanate (AUGMENTIN ) 875-125 MG tablet Take 1 tablet by mouth 2 (two) times daily.   No facility-administered medications prior to visit.   "

## 2024-08-07 ENCOUNTER — Ambulatory Visit: Admitting: Adult Health
